# Patient Record
Sex: Female | Born: 1950 | Race: White | Hispanic: No | Smoking: Never smoker
Health system: Southern US, Community
[De-identification: ages and names within clinical notes are randomized; demographics above are authoritative.]

## PROBLEM LIST (undated history)

## (undated) DIAGNOSIS — R9431 Abnormal electrocardiogram [ECG] [EKG]: Secondary | ICD-10-CM

## (undated) DIAGNOSIS — I10 Essential (primary) hypertension: Secondary | ICD-10-CM

## (undated) DIAGNOSIS — N189 Chronic kidney disease, unspecified: Secondary | ICD-10-CM

## (undated) DIAGNOSIS — E119 Type 2 diabetes mellitus without complications: Secondary | ICD-10-CM

## (undated) DIAGNOSIS — E785 Hyperlipidemia, unspecified: Secondary | ICD-10-CM

## (undated) DIAGNOSIS — T8859XA Other complications of anesthesia, initial encounter: Secondary | ICD-10-CM

## (undated) DIAGNOSIS — F419 Anxiety disorder, unspecified: Secondary | ICD-10-CM

## (undated) DIAGNOSIS — Z8739 Personal history of other diseases of the musculoskeletal system and connective tissue: Secondary | ICD-10-CM

## (undated) DIAGNOSIS — M1990 Unspecified osteoarthritis, unspecified site: Secondary | ICD-10-CM

## (undated) DIAGNOSIS — M109 Gout, unspecified: Secondary | ICD-10-CM

## (undated) HISTORY — DX: Gout, unspecified: M10.9

## (undated) HISTORY — DX: Hyperlipidemia, unspecified: E78.5

## (undated) HISTORY — DX: Type 2 diabetes mellitus without complications: E11.9

## (undated) HISTORY — PX: COLONOSCOPY: SHX174

## (undated) HISTORY — DX: Abnormal electrocardiogram (ECG) (EKG): R94.31

## (undated) HISTORY — DX: Essential (primary) hypertension: I10

---
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
## 1983-03-19 HISTORY — PX: OTHER SURGICAL HISTORY: SHX169

## 1983-03-19 HISTORY — PX: APPENDECTOMY: SHX54

## 1999-12-19 ENCOUNTER — Other Ambulatory Visit: Admission: RE | Admit: 1999-12-19 | Discharge: 1999-12-19 | Payer: Self-pay | Admitting: Gastroenterology

## 2005-04-03 ENCOUNTER — Ambulatory Visit: Payer: Self-pay | Admitting: Cardiology

## 2005-04-05 ENCOUNTER — Ambulatory Visit: Payer: Self-pay | Admitting: Cardiovascular Disease

## 2005-04-05 ENCOUNTER — Inpatient Hospital Stay (HOSPITAL_BASED_OUTPATIENT_CLINIC_OR_DEPARTMENT_OTHER): Admission: RE | Admit: 2005-04-05 | Discharge: 2005-04-05 | Payer: Self-pay | Admitting: Cardiology

## 2005-05-10 ENCOUNTER — Ambulatory Visit: Payer: Self-pay | Admitting: Cardiology

## 2013-10-11 ENCOUNTER — Encounter: Payer: Self-pay | Admitting: Gastroenterology

## 2014-08-16 DIAGNOSIS — R10812 Left upper quadrant abdominal tenderness: Secondary | ICD-10-CM | POA: Insufficient documentation

## 2014-09-12 DIAGNOSIS — B029 Zoster without complications: Secondary | ICD-10-CM | POA: Insufficient documentation

## 2015-09-12 DIAGNOSIS — E782 Mixed hyperlipidemia: Secondary | ICD-10-CM | POA: Diagnosis not present

## 2015-09-12 DIAGNOSIS — I10 Essential (primary) hypertension: Secondary | ICD-10-CM | POA: Diagnosis not present

## 2015-09-12 DIAGNOSIS — E1165 Type 2 diabetes mellitus with hyperglycemia: Secondary | ICD-10-CM | POA: Diagnosis not present

## 2015-09-14 DIAGNOSIS — I10 Essential (primary) hypertension: Secondary | ICD-10-CM | POA: Diagnosis not present

## 2015-09-14 DIAGNOSIS — E1165 Type 2 diabetes mellitus with hyperglycemia: Secondary | ICD-10-CM | POA: Diagnosis not present

## 2015-09-14 DIAGNOSIS — E782 Mixed hyperlipidemia: Secondary | ICD-10-CM | POA: Diagnosis not present

## 2015-09-14 DIAGNOSIS — M25511 Pain in right shoulder: Secondary | ICD-10-CM | POA: Diagnosis not present

## 2015-09-14 DIAGNOSIS — M109 Gout, unspecified: Secondary | ICD-10-CM | POA: Diagnosis not present

## 2015-12-13 DIAGNOSIS — E782 Mixed hyperlipidemia: Secondary | ICD-10-CM | POA: Diagnosis not present

## 2015-12-13 DIAGNOSIS — M109 Gout, unspecified: Secondary | ICD-10-CM | POA: Diagnosis not present

## 2015-12-13 DIAGNOSIS — I10 Essential (primary) hypertension: Secondary | ICD-10-CM | POA: Diagnosis not present

## 2015-12-13 DIAGNOSIS — E1165 Type 2 diabetes mellitus with hyperglycemia: Secondary | ICD-10-CM | POA: Diagnosis not present

## 2015-12-17 DIAGNOSIS — M25519 Pain in unspecified shoulder: Secondary | ICD-10-CM | POA: Insufficient documentation

## 2015-12-17 DIAGNOSIS — Z6829 Body mass index (BMI) 29.0-29.9, adult: Secondary | ICD-10-CM | POA: Insufficient documentation

## 2015-12-18 DIAGNOSIS — Z1389 Encounter for screening for other disorder: Secondary | ICD-10-CM | POA: Diagnosis not present

## 2015-12-18 DIAGNOSIS — M25511 Pain in right shoulder: Secondary | ICD-10-CM | POA: Diagnosis not present

## 2015-12-18 DIAGNOSIS — E1165 Type 2 diabetes mellitus with hyperglycemia: Secondary | ICD-10-CM | POA: Diagnosis not present

## 2015-12-18 DIAGNOSIS — I10 Essential (primary) hypertension: Secondary | ICD-10-CM | POA: Diagnosis not present

## 2015-12-18 DIAGNOSIS — M109 Gout, unspecified: Secondary | ICD-10-CM | POA: Diagnosis not present

## 2015-12-18 DIAGNOSIS — E782 Mixed hyperlipidemia: Secondary | ICD-10-CM | POA: Diagnosis not present

## 2015-12-18 DIAGNOSIS — Z6829 Body mass index (BMI) 29.0-29.9, adult: Secondary | ICD-10-CM | POA: Diagnosis not present

## 2016-02-20 DIAGNOSIS — I10 Essential (primary) hypertension: Secondary | ICD-10-CM | POA: Insufficient documentation

## 2016-02-20 DIAGNOSIS — K219 Gastro-esophageal reflux disease without esophagitis: Secondary | ICD-10-CM | POA: Insufficient documentation

## 2016-02-21 DIAGNOSIS — I10 Essential (primary) hypertension: Secondary | ICD-10-CM | POA: Diagnosis not present

## 2016-02-21 DIAGNOSIS — E1165 Type 2 diabetes mellitus with hyperglycemia: Secondary | ICD-10-CM | POA: Diagnosis not present

## 2016-02-21 DIAGNOSIS — K219 Gastro-esophageal reflux disease without esophagitis: Secondary | ICD-10-CM | POA: Diagnosis not present

## 2016-02-21 DIAGNOSIS — E782 Mixed hyperlipidemia: Secondary | ICD-10-CM | POA: Diagnosis not present

## 2016-02-21 DIAGNOSIS — M109 Gout, unspecified: Secondary | ICD-10-CM | POA: Diagnosis not present

## 2016-02-25 DIAGNOSIS — E782 Mixed hyperlipidemia: Secondary | ICD-10-CM | POA: Insufficient documentation

## 2016-02-25 DIAGNOSIS — Z6830 Body mass index (BMI) 30.0-30.9, adult: Secondary | ICD-10-CM | POA: Insufficient documentation

## 2016-02-25 DIAGNOSIS — Z0000 Encounter for general adult medical examination without abnormal findings: Secondary | ICD-10-CM | POA: Insufficient documentation

## 2016-02-25 DIAGNOSIS — M109 Gout, unspecified: Secondary | ICD-10-CM | POA: Insufficient documentation

## 2016-02-25 DIAGNOSIS — R9431 Abnormal electrocardiogram [ECG] [EKG]: Secondary | ICD-10-CM | POA: Insufficient documentation

## 2016-02-25 DIAGNOSIS — E119 Type 2 diabetes mellitus without complications: Secondary | ICD-10-CM | POA: Insufficient documentation

## 2016-02-26 DIAGNOSIS — E782 Mixed hyperlipidemia: Secondary | ICD-10-CM | POA: Diagnosis not present

## 2016-02-26 DIAGNOSIS — I10 Essential (primary) hypertension: Secondary | ICD-10-CM | POA: Diagnosis not present

## 2016-02-26 DIAGNOSIS — Z0000 Encounter for general adult medical examination without abnormal findings: Secondary | ICD-10-CM | POA: Diagnosis not present

## 2016-02-26 DIAGNOSIS — R9431 Abnormal electrocardiogram [ECG] [EKG]: Secondary | ICD-10-CM | POA: Diagnosis not present

## 2016-02-26 DIAGNOSIS — E1165 Type 2 diabetes mellitus with hyperglycemia: Secondary | ICD-10-CM | POA: Diagnosis not present

## 2016-02-26 DIAGNOSIS — M109 Gout, unspecified: Secondary | ICD-10-CM | POA: Diagnosis not present

## 2016-04-05 ENCOUNTER — Encounter: Payer: Self-pay | Admitting: *Deleted

## 2016-04-08 ENCOUNTER — Ambulatory Visit (INDEPENDENT_AMBULATORY_CARE_PROVIDER_SITE_OTHER): Payer: Medicare Other | Admitting: Cardiovascular Disease

## 2016-04-08 ENCOUNTER — Encounter: Payer: Self-pay | Admitting: *Deleted

## 2016-04-08 ENCOUNTER — Encounter: Payer: Self-pay | Admitting: Cardiovascular Disease

## 2016-04-08 VITALS — BP 142/89 | HR 86 | Ht 62.0 in | Wt 174.0 lb

## 2016-04-08 DIAGNOSIS — E785 Hyperlipidemia, unspecified: Secondary | ICD-10-CM | POA: Diagnosis not present

## 2016-04-08 DIAGNOSIS — R0602 Shortness of breath: Secondary | ICD-10-CM

## 2016-04-08 DIAGNOSIS — I10 Essential (primary) hypertension: Secondary | ICD-10-CM

## 2016-04-08 DIAGNOSIS — R5383 Other fatigue: Secondary | ICD-10-CM

## 2016-04-08 DIAGNOSIS — R9431 Abnormal electrocardiogram [ECG] [EKG]: Secondary | ICD-10-CM

## 2016-04-08 NOTE — Patient Instructions (Signed)
Medication Instructions:  Continue all current medications.  Labwork: none  Testing/Procedures: Your physician has requested that you have en exercise stress myoview. For further information please visit [email or web address]. Please follow instruction sheet, as given.  Office will contact with results via phone or letter.    Follow-Up: 2 months   Any Other Special Instructions Will Be Listed Below (If Applicable).  If you need a refill on your cardiac medications before your next appointment, please call your pharmacy.  

## 2016-04-08 NOTE — Progress Notes (Signed)
CARDIOLOGY CONSULT NOTE  Patient ID: Angelica Conway MRN: [id number] DOB/AGE: 1950-07-23 66 y.o.  Admit date: (Not on file) Primary Physician: Jeri Modena Referring Physician:   Reason for Consultation: abnormal ECG  HPI: The patient is a 66 year old woman with a history of hypertension and hyperlipidemia referred for an abnormal ECG. ECG at PCPs office demonstrated sinus rhythm with a right bundle branch block.  A review of labs performed on 02/21/16 showed total cholesterol 205, triglycerides 268, HDL 35, LDL 118, hemoglobin 12.9, BUN 26, creatinine 1.14.  She has had progressive fatigue over the past 12 months. She has fatigue with routine household activities. She has occasional shortness of breath with this but denies exertional chest pain. She reports having undergone coronary angiography more than 10 years ago at [facility].  She denies lightheadedness, dizziness, and syncope.  Family history: Father died of MI at age 66.    Allergies  Allergen Reactions  . Amlodipine   . Codeine   . Influenza Vaccines Other (See Comments)    Local reaction at site of injection    Current Outpatient Prescriptions  Medication Sig Dispense Refill  . allopurinol (ZYLOPRIM) 300 MG tablet Take 300 mg by mouth daily.     Marland Kitchen diltiazem (CARDIZEM CD) 120 MG 24 hr capsule Take 120 mg by mouth daily.     Marland Kitchen gemfibrozil (LOPID) 600 MG tablet Take by mouth.    Marland Kitchen glipiZIDE (GLUCOTROL) 10 MG tablet Take 10 mg by mouth 2 (two) times daily before a meal.     . lisinopril-hydrochlorothiazide (PRINZIDE,ZESTORETIC) 20-12.5 MG tablet Take 2 tablets by mouth daily.     . metFORMIN (GLUCOPHAGE) 1000 MG tablet Take 1,000 mg by mouth 2 (two) times daily with a meal.     . metoprolol (LOPRESSOR) 100 MG tablet Take 100 mg by mouth 2 (two) times daily.     Marland Kitchen omeprazole (PRILOSEC) 20 MG capsule Take 20 mg by mouth daily.      No current facility-administered medications for this visit.      Past Medical History:  Diagnosis Date  . Abnormal EKG   . Diabetes mellitus without complication ([place])   . Gout   . Hyperlipidemia   . Hypertension     Past Surgical History:  Procedure Laterality Date  . APPENDECTOMY  1985  . cholecystectomy  1985  . TONSILLECTOMY  1957    Social History   Social History  . Marital status: Widowed    Spouse name: N/A  . Number of children: N/A  . Years of education: N/A   Occupational History  . Not on file.   Social History Main Topics  . Smoking status: Never Smoker  . Smokeless tobacco: Never Used  . Alcohol use No  . Drug use: Unknown  . Sexual activity: Not on file   Other Topics Concern  . Not on file   Social History Narrative  . No narrative on file       Prior to Admission medications   Medication Sig Start Date End Date Taking? Authorizing Provider  allopurinol (ZYLOPRIM) 300 MG tablet Take 300 mg by mouth daily.  02/26/16 08/23/16  Historical Provider, MD  diltiazem (CARDIZEM CD) 120 MG 24 hr capsule Take 120 mg by mouth daily.  02/26/16 08/23/16  Historical Provider, MD  gemfibrozil (LOPID) 600 MG tablet Take by mouth. 02/26/16 08/23/16  Historical Provider, MD  glipiZIDE (GLUCOTROL) 10 MG tablet Take 10 mg by mouth 2 (two)  times daily before a meal.  02/26/16 08/23/16  Historical Provider, MD  HYDROcodone-acetaminophen (NORCO/VICODIN) 5-325 MG tablet Take 1 tablet by mouth every 4 (four) hours as needed.  09/13/14   Historical Provider, MD  lisinopril-hydrochlorothiazide (PRINZIDE,ZESTORETIC) 20-12.5 MG tablet Take 2 tablets by mouth daily.  02/26/16 08/23/16  Historical Provider, MD  metFORMIN (GLUCOPHAGE) 1000 MG tablet Take 1,000 mg by mouth 2 (two) times daily with a meal.  02/26/16 08/23/16  Historical Provider, MD  metoprolol (LOPRESSOR) 100 MG tablet Take 100 mg by mouth 2 (two) times daily.  02/26/16 08/23/16  Historical Provider, MD  omeprazole (PRILOSEC) 20 MG capsule Take 20 mg by mouth daily.  12/18/15 06/14/16   Historical Provider, MD     Review of systems complete and found to be negative unless listed above in HPI     Physical exam Blood pressure (!) 152/87, pulse 85, height 5\' 2"  (1.575 m), weight 174 lb (78.9 kg), SpO2 98 %. General: NAD Neck: No JVD, no thyromegaly or thyroid nodule.  Lungs: Clear to auscultation bilaterally with normal respiratory effort. CV: Nondisplaced PMI. Regular rate and rhythm, normal S1/S2, no S3/S4, no murmur.  No peripheral edema.  No carotid bruit.   Abdomen: Soft, nontender, obese.  Skin: Intact without lesions or rashes.  Neurologic: Alert and oriented x 3.  Psych: Normal affect. Extremities: No clubbing or cyanosis.  HEENT: Normal.   ECG: Most recent ECG reviewed.  Labs:  No results found for: WBC, HGB, HCT, MCV, PLT No results for input(s): NA, K, CL, CO2, BUN, CREATININE, CALCIUM, PROT, BILITOT, ALKPHOS, ALT, AST, GLUCOSE in the last 168 hours.  Invalid input(s): LABALBU No results found for: CKTOTAL, CKMB, CKMBINDEX, TROPONINI No results found for: CHOL No results found for: HDL No results found for: LDLCALC No results found for: TRIG No results found for: CHOLHDL No results found for: LDLDIRECT       Studies: No results found.  ASSESSMENT AND PLAN:  1. Abnormal ECG/SOB/increased fatigue: ECG reviewed above. Cardiovascular risk factors include hypertension, hyperlipidemia, and diabetes. Will obtain an exercise Myoview nuclear stress test to rule out ischemic heart disease.  2. Dyslipidemia: Given history of diabetes, guidelines recommend statin therapy. She is currently taking Lopid.  3. Hypertension: Mildly elevated. Needs monitoring.  Dispo: fu 2 months.   Signed: Kate Sable, M.D., F.A.C.C.  04/08/2016, 2:50 PM

## 2016-04-11 ENCOUNTER — Inpatient Hospital Stay (HOSPITAL_COMMUNITY): Admission: RE | Admit: 2016-04-11 | Payer: Medicare Other | Source: Ambulatory Visit

## 2016-04-11 ENCOUNTER — Encounter (HOSPITAL_COMMUNITY): Payer: Self-pay

## 2016-04-11 ENCOUNTER — Encounter (HOSPITAL_COMMUNITY)
Admission: RE | Admit: 2016-04-11 | Discharge: 2016-04-11 | Disposition: A | Discharge status: Home / Self Care | Payer: Medicare Other | Source: Ambulatory Visit | Attending: Cardiovascular Disease | Admitting: Cardiovascular Disease

## 2016-04-11 DIAGNOSIS — R9431 Abnormal electrocardiogram [ECG] [EKG]: Secondary | ICD-10-CM

## 2016-04-11 DIAGNOSIS — R0602 Shortness of breath: Secondary | ICD-10-CM

## 2016-04-11 LAB — NM MYOCAR MULTI W/SPECT W/WALL MOTION / EF
CHL CUP NUCLEAR SDS: 0
CHL CUP NUCLEAR SRS: 1
CSEPEDS: 34 s
CSEPEW: 4.6 METS
CSEPHR: 91 %
CSEPPHR: 142 {beats}/min
Exercise duration (min): 2 min
LHR: 0.34
LVDIAVOL: 35 mL (ref 46–106)
LVSYSVOL: 12 mL
MPHR: 155 {beats}/min
RPE: 17
Rest HR: 96 {beats}/min
SSS: 1
TID: 1.01

## 2016-04-11 MED ORDER — TECHNETIUM TC 99M TETROFOSMIN IV KIT
10.0000 | PACK | Freq: Once | INTRAVENOUS | Status: AC | PRN
  Administered 2016-04-11: 10 via INTRAVENOUS

## 2016-04-11 MED ORDER — SODIUM CHLORIDE 0.9% FLUSH
INTRAVENOUS | Status: AC
  Administered 2016-04-11: 10 mL via INTRAVENOUS
  Filled 2016-04-11: qty 10

## 2016-04-11 MED ORDER — TECHNETIUM TC 99M TETROFOSMIN IV KIT
30.0000 | PACK | Freq: Once | INTRAVENOUS | Status: AC | PRN
  Administered 2016-04-11: 31 via INTRAVENOUS

## 2016-04-11 MED ORDER — REGADENOSON 0.4 MG/5ML IV SOLN
INTRAVENOUS | Status: AC
  Filled 2016-04-11: qty 5

## 2016-06-11 ENCOUNTER — Encounter: Payer: Self-pay | Admitting: Cardiovascular Disease

## 2016-06-11 ENCOUNTER — Ambulatory Visit (INDEPENDENT_AMBULATORY_CARE_PROVIDER_SITE_OTHER): Payer: Medicare Other | Admitting: Cardiovascular Disease

## 2016-06-11 VITALS — BP 130/81 | HR 74 | Ht 62.0 in | Wt 176.8 lb

## 2016-06-11 DIAGNOSIS — R9431 Abnormal electrocardiogram [ECG] [EKG]: Secondary | ICD-10-CM | POA: Diagnosis not present

## 2016-06-11 DIAGNOSIS — I10 Essential (primary) hypertension: Secondary | ICD-10-CM

## 2016-06-11 DIAGNOSIS — R5383 Other fatigue: Secondary | ICD-10-CM

## 2016-06-11 DIAGNOSIS — R0602 Shortness of breath: Secondary | ICD-10-CM | POA: Diagnosis not present

## 2016-06-11 DIAGNOSIS — E785 Hyperlipidemia, unspecified: Secondary | ICD-10-CM

## 2016-06-11 NOTE — Progress Notes (Signed)
SUBJECTIVE: The patient returns for follow-up after undergoing cardiovascular testing performed for the evaluation of abnormal ECG, shortness of breath, and fatigue.  Nuclear stress test 04/11/16: Hypertensive response to exercise, intermediate risk Duke treadmill score, no evidence of myocardial ischemia or scar, low risk study based on perfusion imaging, LVEF 65%.  She has occasional chest pains but she is not sure if it is due to anxiety. She has had shortness of breath and she was a child. She developed hypertension at the age of 57.   Review of Systems: As per "subjective", otherwise negative.  Allergies  Allergen Reactions  . Amlodipine   . Codeine   . Influenza Vaccines Other (See Comments)    Local reaction at site of injection    Current Outpatient Prescriptions  Medication Sig Dispense Refill  . allopurinol (ZYLOPRIM) 300 MG tablet Take 300 mg by mouth daily.     Marland Kitchen diltiazem (CARDIZEM CD) 120 MG 24 hr capsule Take 120 mg by mouth daily.     Marland Kitchen gemfibrozil (LOPID) 600 MG tablet Take by mouth.    Marland Kitchen glipiZIDE (GLUCOTROL) 10 MG tablet Take 10 mg by mouth 2 (two) times daily before a meal.     . lisinopril-hydrochlorothiazide (PRINZIDE,ZESTORETIC) 20-12.5 MG tablet Take 2 tablets by mouth daily.     . metFORMIN (GLUCOPHAGE) 1000 MG tablet Take 1,000 mg by mouth 2 (two) times daily with a meal.     . metoprolol (LOPRESSOR) 100 MG tablet Take 100 mg by mouth 2 (two) times daily.     Marland Kitchen omeprazole (PRILOSEC) 20 MG capsule Take 20 mg by mouth daily.      No current facility-administered medications for this visit.     Past Medical History:  Diagnosis Date  . Abnormal EKG   . Diabetes mellitus without complication ([place])   . Gout   . Hyperlipidemia   . Hypertension     Past Surgical History:  Procedure Laterality Date  . APPENDECTOMY  1985  . cholecystectomy  1985  . TONSILLECTOMY  1957    Social History   Social History  . Marital status: Widowed    Spouse  name: N/A  . Number of children: N/A  . Years of education: N/A   Occupational History  . Not on file.   Social History Main Topics  . Smoking status: Never Smoker  . Smokeless tobacco: Never Used  . Alcohol use No  . Drug use: Unknown  . Sexual activity: Not on file   Other Topics Concern  . Not on file   Social History Narrative  . No narrative on file     Vitals:   06/11/16 1035  BP: 130/81  Pulse: 74  SpO2: 98%  Weight: 176 lb 12.8 oz (80.2 kg)  Height: 5\' 2"  (1.575 m)    PHYSICAL EXAM General: NAD HEENT: Normal. Neck: No JVD, no thyromegaly. Lungs: Clear to auscultation bilaterally with normal respiratory effort. CV: Nondisplaced PMI.  Regular rate and rhythm, normal S1/S2, no S3/S4, no murmur. No pretibial or periankle edema.  No carotid bruit.   Abdomen: Soft, nontender, no distention.  Neurologic: Alert and oriented.  Psych: Normal affect. Skin: Normal. Musculoskeletal: No gross deformities.    ECG: Most recent ECG reviewed.      ASSESSMENT AND PLAN: 1. Abnormal ECG/SOB/increased fatigue: Low risk nuclear stress test as detailed above. Cardiovascular risk factors include hypertension, hyperlipidemia, and diabetes. Primary prevention of CAD emphasized.  2. Dyslipidemia: Given history of diabetes, guidelines  recommend statin therapy. She is currently taking Lopid.  3. Hypertension: Controlled. No changes.  Dispo: fu prn.   Kate Sable, M.D., F.A.C.C.

## 2016-06-11 NOTE — Patient Instructions (Signed)
Medication Instructions:  Continue all current medications.  Labwork: none  Testing/Procedures: none  Follow-Up: As needed.    Any Other Special Instructions Will Be Listed Below (If Applicable).  If you need a refill on your cardiac medications before your next appointment, please call your pharmacy.  

## 2016-07-15 DIAGNOSIS — E1165 Type 2 diabetes mellitus with hyperglycemia: Secondary | ICD-10-CM | POA: Diagnosis not present

## 2016-07-15 DIAGNOSIS — E782 Mixed hyperlipidemia: Secondary | ICD-10-CM | POA: Diagnosis not present

## 2016-07-15 DIAGNOSIS — M109 Gout, unspecified: Secondary | ICD-10-CM | POA: Diagnosis not present

## 2016-07-15 DIAGNOSIS — K219 Gastro-esophageal reflux disease without esophagitis: Secondary | ICD-10-CM | POA: Diagnosis not present

## 2016-07-15 DIAGNOSIS — I10 Essential (primary) hypertension: Secondary | ICD-10-CM | POA: Diagnosis not present

## 2020-12-26 DIAGNOSIS — I10 Essential (primary) hypertension: Secondary | ICD-10-CM | POA: Insufficient documentation

## 2021-01-29 ENCOUNTER — Encounter: Payer: Self-pay | Admitting: *Deleted

## 2021-01-30 ENCOUNTER — Ambulatory Visit: Payer: Medicare Other | Admitting: Cardiology

## 2021-01-30 ENCOUNTER — Encounter: Payer: Self-pay | Admitting: *Deleted

## 2021-01-30 ENCOUNTER — Encounter: Payer: Self-pay | Admitting: Cardiology

## 2021-01-30 VITALS — BP 200/102 | HR 109 | Ht 62.0 in | Wt 172.6 lb

## 2021-01-30 DIAGNOSIS — I10 Essential (primary) hypertension: Secondary | ICD-10-CM

## 2021-01-30 DIAGNOSIS — R001 Bradycardia, unspecified: Secondary | ICD-10-CM | POA: Diagnosis not present

## 2021-01-30 MED ORDER — HYDRALAZINE HCL 25 MG PO TABS: 50.0000 mg | ORAL_TABLET | Freq: Three times a day (TID) | ORAL | Status: DC

## 2021-01-30 NOTE — Progress Notes (Signed)
Clinical Summary Ms. Skousen is a 70 y.o.female last seen by Dr Bronson Ing in 2018, this is a new visit and our first visit together.      1. Bradycardia -ER visit [facility] 12/26/2020 with low HRs - was on cardizem and metoprolol at the time. Cardizem had recently been increased to 240mg  daily due to high bp's - from ER notes HRs in 30s, given atropine and calcium - HR imporved off meds    2. HTN - dilt and lopressor stopped after ER visit - was to start hydralazine 25mg  tid,a s well as lisinopril/HCTZ 40/25 - listed allergy to norvasc not specified. Leg edema per her report.    Past Medical History:  Diagnosis Date   Abnormal EKG    Diabetes mellitus without complication ([facility])    Gout    Hyperlipidemia    Hypertension      Allergies  Allergen Reactions   Amlodipine    Codeine    Influenza Vaccines Other (See Comments)    Local reaction at site of injection     Current Outpatient Medications  Medication Sig Dispense Refill   allopurinol (ZYLOPRIM) 300 MG tablet Take 300 mg by mouth daily.      diltiazem (CARDIZEM CD) 120 MG 24 hr capsule Take 120 mg by mouth daily.      gemfibrozil (LOPID) 600 MG tablet Take by mouth.     glipiZIDE (GLUCOTROL) 10 MG tablet Take 10 mg by mouth 2 (two) times daily before a meal.      lisinopril-hydrochlorothiazide (PRINZIDE,ZESTORETIC) 20-12.5 MG tablet Take 2 tablets by mouth daily.      metFORMIN (GLUCOPHAGE) 1000 MG tablet Take 1,000 mg by mouth 2 (two) times daily with a meal.      metoprolol (LOPRESSOR) 100 MG tablet Take 100 mg by mouth 2 (two) times daily.      omeprazole (PRILOSEC) 20 MG capsule Take 20 mg by mouth daily.      No current facility-administered medications for this visit.       Allergies  Allergen Reactions   Amlodipine    Codeine    Influenza Vaccines Other (See Comments)    Local reaction at site of injection      Family History  Problem Relation Age of Onset   Lung cancer Mother     Heart attack Father    Heart disease Father    Diabetes Sister      Social History Ms. Batts reports that she has never smoked. She has never used smokeless tobacco. Ms. Riden reports no history of alcohol use.   Review of Systems CONSTITUTIONAL: No weight loss, fever, chills, weakness or fatigue.  HEENT: Eyes: No visual loss, blurred vision, double vision or yellow sclerae.No hearing loss, sneezing, congestion, runny nose or sore throat.  SKIN: No rash or itching.  CARDIOVASCULAR: per hpi RESPIRATORY: No shortness of breath, cough or sputum.  GASTROINTESTINAL: No anorexia, nausea, vomiting or diarrhea. No abdominal pain or blood.  GENITOURINARY: No burning on urination, no polyuria NEUROLOGICAL: No headache, dizziness, syncope, paralysis, ataxia, numbness or tingling in the extremities. No change in bowel or bladder control.  MUSCULOSKELETAL: No muscle, back pain, joint pain or stiffness.  LYMPHATICS: No enlarged nodes. No history of splenectomy.  PSYCHIATRIC: No history of depression or anxiety.  ENDOCRINOLOGIC: No reports of sweating, cold or heat intolerance. No polyuria or polydipsia.  Marland Kitchen   Physical Examination Today's Vitals   01/30/21 1457  BP: (!) 200/102  Pulse: Marland Kitchen)  109  SpO2: 97%  Weight: 172 lb 9.6 oz (78.3 kg)  Height: 5\' 2"  (1.575 m)   Body mass index is 31.57 kg/m.  Gen: resting comfortably, no acute distress HEENT: no scleral icterus, pupils equal round and reactive, no palptable cervical adenopathy,  CV: regular, mild tachy 105, no m/r/g no jvd Resp: Clear to auscultation bilaterally GI: abdomen is soft, non-tender, non-distended, normal bowel sounds, no hepatosplenomegaly MSK: extremities are warm, no edema.  Skin: warm, no rash Neuro:  no focal deficits Psych: appropriate affect    Assessment and Plan  Bradycardia Secondary to high dose lopressor and diltiazem, now off both meds with resolution of bradycardia - I suspect mild sinus tach is her  baseline, for years she was on lopressor 100mg  bid and dilt 120 with rates in 70s, I suspect this is her baseline rate off av nodal agents. Recent EKG from pcp did show she was in sinus  2. HTN - avoid av nodal agents - some renal dysfunction from last labs, keep ACEi and diuretic where they are - increase hydralaazine to 50mg  tid. She will call later in the week with update on bp's, room to titratre hydral further - repeat labs end of the week, pending results could adjust ACEi or diuretic, could consider aldactone.       Arnoldo Lenis, M.D.

## 2021-01-30 NOTE — Patient Instructions (Addendum)
Medication Instructions:  Increase Hydralazine to 50mg  three times per day - she will take her 25mg  tabs for now.  Continue all other medications.     Labwork: BMET, TSH - orders given today.  Please do end of week. Office will contact with results via phone or letter.     Testing/Procedures: none  Follow-Up: Your physician wants you to follow up in: 6 months.  You will receive a reminder letter in the mail one-two months in advance.  If you don't receive a letter, please call our office to schedule the follow up appointment   Any Other Special Instructions Will Be Listed Below (If Applicable). Call the office with update on blood pressure readings at the end of the week.   If you need a refill on your cardiac medications before your next appointment, please call your pharmacy.

## 2021-02-02 ENCOUNTER — Telehealth: Payer: Self-pay | Admitting: Cardiology

## 2021-02-02 NOTE — Telephone Encounter (Signed)
Pt c/o BP issue: STAT if pt c/o blurred vision, one-sided weakness or slurred speech  1. What are your last 5 BP readings?  01/31/21 8:00 AM 221/139 HR 108 9:00 PM 224/123 HR 97 02/01/21 8:00 AM 215/133 HR 114 9:00 PM 219/133 HR 97 02/02/21 8:00 AM 215/123 HR 126 Now 216/131 HR 114  2. Are you having any other symptoms (ex. Dizziness, headache, blurred vision, passed out)? No  3. What is your BP issue? Hypertension, was advised to record readings and call in with them today   Has been stressed last few days daughter went into labor has been watching three grandchildren

## 2021-02-05 MED ORDER — HYDRALAZINE HCL 100 MG PO TABS: 100.0000 mg | ORAL_TABLET | Freq: Three times a day (TID) | ORAL | 2 refills | Status: DC

## 2021-02-05 NOTE — Telephone Encounter (Signed)
BP's too elevated, can she increase hydralazine to 100mg  tid, update [place] on bp's in 1 week   Zandra Abts MD

## 2021-02-05 NOTE — Telephone Encounter (Signed)
Patient informed and verbalized understanding of plan. 

## 2021-02-13 ENCOUNTER — Telehealth: Payer: Medicare Other | Admitting: Cardiology

## 2021-02-13 DIAGNOSIS — I10 Essential (primary) hypertension: Secondary | ICD-10-CM

## 2021-02-13 NOTE — Telephone Encounter (Signed)
Pt was advised to callback within a week to give her BP reading  203/111 HR-- 02/06/21--8AM 225/124 HR 101 --02/06/21--9PM  211/123 HR 111-- 02/07/21--8AM 215/119 HR 109 --02/07/21--9PM  215/112 HR 103-- 02/08/21--8AM 233/139 HR 119 --02/08/21--9PM  244/143 HR 116 --02/09/21--8AM 233/129 HR 100 --02/09/21--9PM  219/121 HR 119 --02/10/21--8AM 223/124 HR 114 --02/10/21--9PM  212/112 HR 115 --02/11/21--8AM 184/108 HR 83-- 02/11/21--9PM  215/115 HR 115 --02/12/21--8AM 211/115 HR 108-- 02/12/21--9PM  216/133 HR 101-- 02/13/21--AM

## 2021-02-14 ENCOUNTER — Encounter: Payer: Self-pay | Admitting: *Deleted

## 2021-02-14 NOTE — Telephone Encounter (Signed)
Recent labs done at PCP on 01/31/2021 and available on [facility]-see results below BUN 15 Creatinine 1.170 TSH 3/120  Full lab result requested from PCP

## 2021-02-14 NOTE — Telephone Encounter (Signed)
BP's too high. I don't see that she had the blood work done yet from our last visit. Can wee add a aldosterone renin ratio to her labs and as her to go ahead and get those drawn, will help decide on next med change    Angelica Abts MD

## 2021-02-15 ENCOUNTER — Other Ambulatory Visit: Payer: Self-pay | Admitting: *Deleted

## 2021-02-15 DIAGNOSIS — I10 Essential (primary) hypertension: Secondary | ICD-10-CM

## 2021-02-15 NOTE — Telephone Encounter (Signed)
Please order the aldosterone renin ratio labswork  Zandra Abts MD

## 2021-02-15 NOTE — Telephone Encounter (Signed)
Patient informed and verbalized understanding of plan. Lab order faxed to PCP office per patient request

## 2021-02-21 ENCOUNTER — Telehealth: Payer: Self-pay | Admitting: Cardiology

## 2021-02-21 NOTE — Telephone Encounter (Signed)
New Messasge:     Patient says she need a lab order faxed to her primary doctor(Katherine Skillman at [facility].Phone number is [phone].

## 2021-02-21 NOTE — Telephone Encounter (Signed)
Patient notified orders have been sent already - done on 12/1 & again today.

## 2021-03-08 ENCOUNTER — Telehealth: Payer: Self-pay | Admitting: *Deleted

## 2021-03-08 DIAGNOSIS — I10 Essential (primary) hypertension: Secondary | ICD-10-CM

## 2021-03-08 NOTE — Telephone Encounter (Signed)
Angelica Blazer, LPN  65/78/4696  2:95 PM EST Back to Top    Notified, copy to pcp. Stated that her readings have been running 160's / 80's and heart rate running 112-116.     Arnoldo Lenis, MD  03/07/2021  4:41 PM EST     Labs look fine, likely will start aldactone pending her recent bp's. Does she have any recent home numbers for [place]?   Zandra Abts MD

## 2021-03-08 NOTE — Telephone Encounter (Signed)
Can we start aldactone 25mg  daily, bmet 2 weeks and update [place] on bp's in 2 weeks.   Zandra Abts MD

## 2021-03-09 MED ORDER — SPIRONOLACTONE 25 MG PO TABS: 25.0000 mg | ORAL_TABLET | Freq: Every day | ORAL | 6 refills | Status: DC

## 2021-03-09 NOTE — Telephone Encounter (Signed)
Patient notified and verbalized understanding.  Will send new medication to [facility] now.  She will do her labs at [facility] around 03/23/2021.

## 2021-03-23 ENCOUNTER — Telehealth: Payer: Self-pay | Admitting: Cardiology

## 2021-03-23 DIAGNOSIS — I10 Essential (primary) hypertension: Secondary | ICD-10-CM

## 2021-03-23 NOTE — Telephone Encounter (Signed)
Patient called to give her BP readings: 12/24  168/97 HR 111 12/24 178/100 HR 105 12/25  167/96  HR 101 12/26 158/106 HR 111 12/26 168/97 HR 99 12/27 160/94 HR 111 12/27  169/101  HR 99 12/28  167/102 HR 98 12/28 175/102 HR 102 12/29  152/90 HR 109 12/29 176/98 HR 93 12/30 158/103 HR 101 12/30 160/95 HR 107 12/31 159/96 HR 107 12/31  158/99 HR 95 01/01 162/102 HR 110 01/01 165/95 HR105 01/02 150/90 HR 101 01/02 166/97 HR 103 01/03 165/95 HR 106 01/03 141/80 HR 99 01/04 162/93 HR 109 01/04 153/83 HR 94 01/05 156/93 HR 105 01/05 163/91 HR 101 01/06 154/90 HR 103

## 2021-03-28 MED ORDER — LISINOPRIL 20 MG PO TABS: 20.0000 mg | ORAL_TABLET | Freq: Every day | ORAL | 6 refills | Status: DC

## 2021-03-28 MED ORDER — CHLORTHALIDONE 25 MG PO TABS: 25.0000 mg | ORAL_TABLET | Freq: Every day | ORAL | 6 refills | Status: DC

## 2021-03-28 NOTE — Telephone Encounter (Signed)
Patient notified and verbalized understanding.  New medication sent to [facility] now.  Will mail lab order to home - she will go to [facility] around 04/11/2021.

## 2021-03-28 NOTE — Telephone Encounter (Signed)
BP's are still to high. Can she stop her zestoretic(lisinopril/hctz combo). STart liisinopril 20mg  daily and chlorthalidone 25mg  daily. Check bmet/mg in 2 weeks  Zandra Abts MD

## 2021-04-11 ENCOUNTER — Telehealth: Payer: Self-pay | Admitting: *Deleted

## 2021-04-11 MED ORDER — MAGNESIUM OXIDE 400 MG PO CAPS: 400.0000 mg | ORAL_CAPSULE | Freq: Two times a day (BID) | ORAL | 3 refills | Status: DC

## 2021-04-11 NOTE — Telephone Encounter (Signed)
Patient informed and verbalized understanding of plan. Copy sent to PCP 

## 2021-04-11 NOTE — Telephone Encounter (Signed)
-----   Message from Arnoldo Lenis, MD sent at 04/11/2021  2:33 PM EST ----- Magnesium is low, can she start magnesium oxide 400mg  bid x 4 days, then 400mg  daily. Mildly decreased kidney function but similar to prior range. Needs to be sure to stay well hydrated.   Zandra Abts MD

## 2021-04-13 ENCOUNTER — Telehealth: Payer: Self-pay | Admitting: Cardiology

## 2021-04-13 NOTE — Telephone Encounter (Signed)
Pt c/o BP issue: STAT if pt c/o blurred vision, one-sided weakness or slurred speech  1. What are your last 5 BP readings?  01/16: 8 AM 141/86 HR 106 9 PM 162/93 HR 107 01/17:  8 AM 158/90 HR 111 9 PM 164/94 HR 96 01/18:  8 AM 156/94 HR 114 9 PM 172/97 HR 103 01/19:  8 AM 149/93 HR 109 9 PM 140/82 HR 94 01/20:  8 AM 161/95 HR 101 9 PM 162/96 HR 108 01/21:  8 AM 163/96 HR 108 3 PM 142/85 HR 109 9 PM 161/90 HR 96 01/22: 10:30 AM 170/101 HR 108 9 PM 161/94 HR 95 01/23: 7 AM 160/92 HR 103 9 PM 161/96 HR 97 01/24: 10 AM 161/96 HR 101 9 PM 172/96 HR 98  01/25: 8 AM 180/103 HR 98 9 PM 188/112 HR 97 01/26:  9:30 AM 164/91 HR 109 9 PM 162/98 HR 108 01/27:  8:30 AM 175/103 HR 107  2. Are you having any other symptoms (ex. Dizziness, headache, blurred vision, passed out)? Right leg pain that goes all the way up into back and jittery   3. What is your BP issue? Reporting BP readings. States she has been having pain in her right leg that starts in her ankle up through her knee all the way to her hip since 01/23. On 01/24 the pain started going up through her back. She reports it hurts more when sitting and is the most painful when laying down. She becomes stiff at night due to it, but it begins getting better in the morning once she gets up and start moving around. She is unsure if it is due to one of the two medications she started on 01/16, lisinopril and chlorthalidone. Thinks BP recently being up could be due to the pain she's been in. Has been taking 1000 MG tylenol every 6 hours for it.

## 2021-04-17 NOTE — Telephone Encounter (Signed)
The bp meds she is on would not typically cause leg pains. Sometimes can cause muscle cramps but would not be isolated in just one leg. I would have her see her pcp about the pain. Bp's are still elevated but coule be relted to pain. I would cut back on the tyleonol, that is the max dose and could cause some liver issues. Would take no more than 1000mg  every 8 hours.

## 2021-04-17 NOTE — Telephone Encounter (Signed)
Left message to return call 

## 2021-04-23 ENCOUNTER — Other Ambulatory Visit: Payer: Self-pay | Admitting: Cardiology

## 2021-04-27 DIAGNOSIS — E875 Hyperkalemia: Secondary | ICD-10-CM | POA: Insufficient documentation

## 2021-04-27 DIAGNOSIS — D72829 Elevated white blood cell count, unspecified: Secondary | ICD-10-CM | POA: Insufficient documentation

## 2021-04-27 DIAGNOSIS — E871 Hypo-osmolality and hyponatremia: Secondary | ICD-10-CM | POA: Insufficient documentation

## 2021-04-27 DIAGNOSIS — N183 Chronic kidney disease, stage 3 unspecified: Secondary | ICD-10-CM | POA: Insufficient documentation

## 2021-04-27 DIAGNOSIS — Z87898 Personal history of other specified conditions: Secondary | ICD-10-CM | POA: Insufficient documentation

## 2021-04-27 NOTE — Telephone Encounter (Signed)
Spoke with patient - stated she did see her pcp about a week ago - they have scheduled her for a MRI next Wednesday.  She also has f/u with her pcp on 05/01/2021.

## 2021-05-08 DIAGNOSIS — Z7901 Long term (current) use of anticoagulants: Secondary | ICD-10-CM | POA: Insufficient documentation

## 2021-05-08 DIAGNOSIS — R7989 Other specified abnormal findings of blood chemistry: Secondary | ICD-10-CM | POA: Insufficient documentation

## 2021-05-09 DIAGNOSIS — R339 Retention of urine, unspecified: Secondary | ICD-10-CM | POA: Insufficient documentation

## 2021-05-22 ENCOUNTER — Other Ambulatory Visit: Payer: Self-pay

## 2021-05-22 ENCOUNTER — Ambulatory Visit: Payer: Medicare Other | Admitting: Physician Assistant

## 2021-05-22 ENCOUNTER — Ambulatory Visit: Payer: Medicare Other | Admitting: Urology

## 2021-05-22 VITALS — BP 153/80 | HR 111 | Ht 62.0 in | Wt 166.0 lb

## 2021-05-22 DIAGNOSIS — R339 Retention of urine, unspecified: Secondary | ICD-10-CM | POA: Diagnosis not present

## 2021-05-22 NOTE — Progress Notes (Signed)
? ?05/22/2021 ?11:23 AM  ? ?[name] ?Dec 15, 1950 ?[id number] ? ? ?Assessment: ?Encounter Diagnoses  ?Name Primary?  ? Urinary retention Yes  ? ? ?No follow-ups on file. ? ? ?Plan: ?Orders written for the patient to discontinue oxybutynin and return in 10 to 14 days to attempt voiding trial again.  Catheter care instructions given.  The patient is encouraged to work with therapy as directed in order to become independently ambulatory again to allow easier access to the restroom.  Medical records requested from [facility]. ? ?Chief Complaint: Voiding trial ? ?Referring provider: Rosalee Kaufman, PA-C ?[address] ?[place],  [place] [address] ? ? ?History of Present Illness: ? ?Angelica Conway is a 71 y.o. year old female with h/o CKD with hospital admission at [facility] on 05/08/2021 for hyponatremia with encephalopathy, who is seen in consultation from Rosalee Kaufman, [place] for evaluation of urinary retention.  Foley catheter was placed on 05/08/2021 as patient was unable to void during voiding trial attempted in the emergency department.  Catheter originally placed during hospitalization 1 week prior.  At discharge, the patient was transferred to [facility] rehab and nursing care center where she was placed on oxybutynin on 05/18/2021.  Patient reports a history of urinary issues with retention in the past which she has had treated at [facility] urology. Those medical records not available for review today.  Currently, she denies fever, chills, abdominal pain.  Her Foley is draining well, urine straw in color with mild sediment.  No gross hematuria.  She is working with physical therapy at her facility and is somewhat ambulatory with assistance at this time.   ? ? ?Past Medical History:  ?Past Medical History:  ?Diagnosis Date  ? Abnormal EKG   ? Diabetes mellitus without complication ([place])   ? Gout   ? Hyperlipidemia   ? Hypertension   ? ? ?Past Surgical History:  ?Past Surgical History:   ?Procedure Laterality Date  ? APPENDECTOMY  1985  ? cholecystectomy  1985  ? TONSILLECTOMY  1957  ? ? ?Allergies:  ?Allergies  ?Allergen Reactions  ? Amlodipine   ? Codeine   ? Influenza Vaccines Other (See Comments)  ?  Local reaction at site of injection  ? ? ?Family History:  ?Family History  ?Problem Relation Age of Onset  ? Lung cancer Mother   ? Heart attack Father   ? Heart disease Father   ? Diabetes Sister   ? ? ?Social History:  ?Social History  ? ?Tobacco Use  ? Smoking status: Never  ? Smokeless tobacco: Never  ?Vaping Use  ? Vaping Use: Never used  ?Substance Use Topics  ? Alcohol use: No  ? ? ?Review of symptoms:  ?Constitutional:  Negative for unexplained weight loss, night sweats, fever, chills ?ENT:  Negative for nose bleeds, sinus pain, painful swallowing ?CV:  Negative for chest pain, shortness of breath ?Resp:  Negative for cough, wheezing ?GI:  Negative for nausea, vomiting, diarrhea, bloody stools ?GU:  Positives noted in HPI; otherwise negative for gross hematuria, dysuria, urinary incontinence ?Neuro:  Negative for seizures, slurred speech ?Endocrine:  Negative for polydipsia, polyuria, symptoms of hypoglycemia (dizziness, hunger, sweating) ?Hematologic:  Negative for anemia, purpura, petechia, prolonged or excessive bleeding ?Allergic:  Negative for difficulty breathing or choking as a result of exposure to anything; no shellfish allergy; no allergic response (rash/itch) to materials, foods ? ? ?Physical Exam: ?BP (!) 153/80   Pulse (!) 111   Ht  $'5\' 2"'S$  (1.575 m)   Wt 166 lb (75.3 kg)   BMI 30.36 kg/m?   ?Constitutional:  Alert and oriented, No acute distress. ?HEENT: NCAT, moist mucus membranes.  Trachea midline, no masses. ?Cardiovascular: Regular rate and rhythm without murmur, rub, or gallops ?No clubbing, cyanosis, or edema. ?Respiratory: Normal respiratory effort, clear to auscultation bilaterally ?GI: Abdomen is soft, nontender, nondistended, no abdominal masses ?BACK:  Non-tender  to palpation.  No CVAT ?Skin: No obvious rashes, warm, dry, intact ?Neurologic: Alert and oriented, Cranial nerves grossly intact, no focal deficits, moving all 4 extremities. ?Psychiatric: Appropriate. Normal mood and affect. ? ?Laboratory Data: ?No results found for this or any previous visit (from the past 24 hour(s)). ?Medical records including notes, imaging, lab results reviewed during patient's office visit. ? ? ?Urinalysis ?No results found for: COLORURINE, APPEARANCEUR, [place], [place], [place], [place], [place], KETONESUR, PROTEINUR, [place], NITRITE, LEUKOCYTESUR ? ?No results found for: LABMICR, [place], RBCUA, LABEPIT, MUCUS, BACTERIA ? ?Pertinent Imaging: ? ?No results found for this or any previous visit. ? ?No results found for this or any previous visit. ? ? ? ? ?[place], Berneice Heinrich, PA-C ?[facility] ?  ?

## 2021-05-29 ENCOUNTER — Ambulatory Visit: Payer: Medicare Other | Admitting: Physician Assistant

## 2021-05-29 ENCOUNTER — Other Ambulatory Visit: Payer: Self-pay

## 2021-05-29 VITALS — BP 184/92 | HR 112 | Ht 62.0 in | Wt 154.0 lb

## 2021-05-29 DIAGNOSIS — N289 Disorder of kidney and ureter, unspecified: Secondary | ICD-10-CM

## 2021-05-29 DIAGNOSIS — R339 Retention of urine, unspecified: Secondary | ICD-10-CM | POA: Diagnosis not present

## 2021-05-29 NOTE — Progress Notes (Signed)
Fill and Pull Catheter Removal ? ?Patient is present today for a catheter removal.  Patient was cleaned and prepped in a sterile fashion 366m of sterile water/ saline was instilled into the bladder when the patient felt the urge to urinate. 144mof water was then drained from the balloon.  A 16FR foley cath was removed from the bladder no complications were noted .  Patient as then given some time to void on their own.  Patient can void  35039mn their own after some time.  Patient tolerated well. ? ?Performed by: KouLevi AlandMA ? ?Follow up/ Additional notes: Follow up as scheduled. ?

## 2021-05-29 NOTE — Progress Notes (Signed)
? ?Assessment: ?1. Urinary retention   ?2. Renal function impairment   ? ? ?Plan: ?Voiding trial today successful. Keep appointment with her primary care provider tomorrow to discuss recent labs, renal function and referral to nephrology.  She is advised to hold Myrbetriq which she has had in the past.  Follow-up in 6 weeks for PVR and urinalysis. ? ?Chief Complaint: ?No chief complaint on file. ? ? ?HPI: ?05/29/21 ?Angelica Conway is a 71 y.o. female who presents for continued evaluation of urinary retention and has tolerated her catheter well. She is doing well with PT and is now able to transfer and ambulate with a walker to get to the restroom, but continues to have sciatic pain and difficulty with ambulation. No fever, chills, gross hematuria.  She has held oxybutynin since her last visit.  Primary care follow-up is scheduled for tomorrow to discuss recent labs and need for referral to nephrology for abnormal renal function studies ? ?05/22/21 ?Angelica Conway is a 71 y.o. year old female with h/o CKD with hospital admission at [facility] on 05/08/2021 for hyponatremia with encephalopathy, who is seen in consultation from Rosalee Kaufman, [place] for evaluation of urinary retention.  Foley catheter was placed on 05/08/2021 as patient was unable to void during voiding trial attempted in the emergency department.  Catheter originally placed during hospitalization 1 week prior.  At discharge, the patient was transferred to [facility] rehab and nursing care center where she was placed on oxybutynin on 05/18/2021.  Patient reports a history of urinary issues with retention in the past which she has had treated at [facility] urology. Those medical records not available for review today.  Currently, she denies fever, chills, abdominal pain.  Her Foley is draining well, urine straw in color with mild sediment.  No gross hematuria.  She is working with physical therapy at her facility and is somewhat ambulatory  with assistance at this time.   ?  ? ?Portions of the above documentation were copied from a prior visit for review purposes only. ? ?Allergies: ?Allergies  ?Allergen Reactions  ? Amlodipine   ? Codeine   ? Influenza Vaccines Other (See Comments)  ?  Local reaction at site of injection  ? ? ?PMH: ?Past Medical History:  ?Diagnosis Date  ? Abnormal EKG   ? Diabetes mellitus without complication ([place])   ? Gout   ? Hyperlipidemia   ? Hypertension   ? ? ?PSH: ?Past Surgical History:  ?Procedure Laterality Date  ? APPENDECTOMY  1985  ? cholecystectomy  1985  ? TONSILLECTOMY  1957  ? ? ?SH: ?Social History  ? ?Tobacco Use  ? Smoking status: Never  ? Smokeless tobacco: Never  ?Vaping Use  ? Vaping Use: Never used  ?Substance Use Topics  ? Alcohol use: No  ? ? ?ROS: ?Constitutional:  Negative for fever, chills, weight loss ?CV: Negative for chest pain ?Respiratory:  Negative for shortness of breath, wheezing, sleep apnea, frequent cough ?GI:  Negative for nausea, vomiting, bloody stool, GERD ? ?PE: ?BP (!) 184/92   Pulse (!) 112   Ht '5\' 2"'$  (1.575 m)   Wt 154 lb (69.9 kg)   BMI 28.17 kg/m?  ?GENERAL APPEARANCE:  Well appearing, well developed, well nourished, NAD ?HEENT:  Atraumatic, normocephalic ?NECK:  Supple. Trachea midline ?ABDOMEN:  Soft, non-tender, no masses ?EXTREMITIES:  Moves all extremities well, without clubbing, cyanosis, or edema ?NEUROLOGIC:  Alert and oriented x 3 ?MENTAL STATUS:  appropriate ?BACK:  No CVAT ?SKIN:  Warm, dry, and intact ? ? ?Results: ?Laboratory Data: ?No results found for: WBC, HGB, HCT, MCV, PLT ? ? ?Urinalysis ?No results found for: COLORURINE, APPEARANCEUR, [place], [place], [place], [place], [place], KETONESUR, PROTEINUR, [place], NITRITE, LEUKOCYTESUR ? ?No results found for: LABMICR, [place], RBCUA, LABEPIT, MUCUS, BACTERIA ? ?Pertinent Imaging: ? ?No results found for this or any previous visit. ? ?No results found for this or any previous visit. ? ?No results found for  this or any previous visit. ? ?No results found for this or any previous visit. ? ?No results found for this or any previous visit. ? ?No results found for this or any previous visit. ? ?No results found for this or any previous visit. ? ?No results found for this or any previous visit. ? ?No results found for this or any previous visit (from the past 24 hour(s)).  ?

## 2021-07-06 ENCOUNTER — Ambulatory Visit: Payer: Medicare Other | Admitting: Physician Assistant

## 2021-07-09 ENCOUNTER — Ambulatory Visit: Payer: Medicare Other | Admitting: Physician Assistant

## 2021-07-20 ENCOUNTER — Ambulatory Visit: Payer: Medicare Other | Admitting: Physician Assistant

## 2021-07-26 ENCOUNTER — Other Ambulatory Visit (HOSPITAL_COMMUNITY): Payer: Self-pay | Admitting: Nephrology

## 2021-07-26 ENCOUNTER — Other Ambulatory Visit: Payer: Self-pay | Admitting: Nephrology

## 2021-07-26 DIAGNOSIS — E1122 Type 2 diabetes mellitus with diabetic chronic kidney disease: Secondary | ICD-10-CM

## 2021-07-26 DIAGNOSIS — R809 Proteinuria, unspecified: Secondary | ICD-10-CM

## 2021-07-26 DIAGNOSIS — E875 Hyperkalemia: Secondary | ICD-10-CM

## 2021-07-26 DIAGNOSIS — E871 Hypo-osmolality and hyponatremia: Secondary | ICD-10-CM

## 2021-08-03 ENCOUNTER — Encounter: Payer: Self-pay | Admitting: Cardiology

## 2021-08-03 ENCOUNTER — Ambulatory Visit (HOSPITAL_COMMUNITY)
Admission: RE | Admit: 2021-08-03 | Discharge: 2021-08-03 | Disposition: A | Discharge status: Home / Self Care | Payer: Medicare Other | Source: Ambulatory Visit | Attending: Nephrology | Admitting: Nephrology

## 2021-08-03 ENCOUNTER — Ambulatory Visit: Payer: Medicare Other | Admitting: Cardiology

## 2021-08-03 VITALS — BP 136/88 | HR 99 | Ht 62.0 in | Wt 136.0 lb

## 2021-08-03 DIAGNOSIS — E1129 Type 2 diabetes mellitus with other diabetic kidney complication: Secondary | ICD-10-CM | POA: Diagnosis present

## 2021-08-03 DIAGNOSIS — R001 Bradycardia, unspecified: Secondary | ICD-10-CM | POA: Diagnosis not present

## 2021-08-03 DIAGNOSIS — E1122 Type 2 diabetes mellitus with diabetic chronic kidney disease: Secondary | ICD-10-CM | POA: Insufficient documentation

## 2021-08-03 DIAGNOSIS — R809 Proteinuria, unspecified: Secondary | ICD-10-CM | POA: Insufficient documentation

## 2021-08-03 DIAGNOSIS — I10 Essential (primary) hypertension: Secondary | ICD-10-CM

## 2021-08-03 DIAGNOSIS — E871 Hypo-osmolality and hyponatremia: Secondary | ICD-10-CM | POA: Diagnosis not present

## 2021-08-03 DIAGNOSIS — E875 Hyperkalemia: Secondary | ICD-10-CM | POA: Diagnosis present

## 2021-08-03 NOTE — Patient Instructions (Signed)
Medication Instructions:  Continue all current medications.   Labwork: none  Testing/Procedures: none  Follow-Up: 6 months   Any Other Special Instructions Will Be Listed Below (If Applicable).   If you need a refill on your cardiac medications before your next appointment, please call your pharmacy.  

## 2021-08-03 NOTE — Progress Notes (Signed)
Clinical Summary Angelica Conway is a 71 y.o.female seen today for follow up of the following medical problems.   1. Bradycardia -ER visit [facility] 12/26/2020 with low HRs - was on cardizem and metoprolol at the time. Cardizem had recently been increased to '240mg'$  daily due to high bp's - from ER notes HRs in 30s, given atropine and calcium - HR imporved off meds  - no recent issues       2. HTN - dilt and lopressor stopped after ER visit - was to start hydralazine '25mg'$  tid,a s well as lisinopril/HCTZ 40/25 - listed allergy to norvasc not specified. Leg edema per her report.   -02/2021 normal renin/aldo levels  - neprhology started coreg 07/21/21  - home bp's 130s-140s/80s-90s  3. CKD III - followed by Dr Theador Hawthorne  4. Hyponatremia - from neprhology notes component of [facility]  Past Medical History:  Diagnosis Date   Abnormal EKG    Diabetes mellitus without complication (HCC)    Gout    Hyperlipidemia    Hypertension      Allergies  Allergen Reactions   Amlodipine    Codeine    Influenza Vaccines Other (See Comments)    Local reaction at site of injection     Current Outpatient Medications  Medication Sig Dispense Refill   allopurinol (ZYLOPRIM) 300 MG tablet Take 300 mg by mouth daily.      atorvastatin (LIPITOR) 80 MG tablet Take 80 mg by mouth daily.     chlorthalidone (HYGROTON) 25 MG tablet Take 1 tablet (25 mg total) by mouth daily. 30 tablet 6   gabapentin (NEURONTIN) 100 MG capsule SMARTSIG:1 Capsule(s) By Mouth Every Evening (Patient not taking: Reported on 05/29/2021)     gemfibrozil (LOPID) 600 MG tablet Take by mouth.     glipiZIDE (GLUCOTROL) 10 MG tablet Take 10 mg by mouth 2 (two) times daily before a meal.      hydrALAZINE (APRESOLINE) 100 MG tablet TAKE 1 TABLET BY MOUTH THREE TIMES DAILY 90 tablet 6   lisinopril (ZESTRIL) 20 MG tablet Take 1 tablet (20 mg total) by mouth daily. 30 tablet 6   Magnesium Oxide 400 MG CAPS Take 1 capsule (400 mg  total) by mouth 2 (two) times daily. For 4 days, then reduce to daily 38 capsule 3   metFORMIN (GLUCOPHAGE) 1000 MG tablet Take 1,000 mg by mouth 2 (two) times daily with a meal.      MYRBETRIQ 25 MG TB24 tablet Take 25 mg by mouth daily.     omeprazole (PRILOSEC) 20 MG capsule Take 20 mg by mouth daily.      spironolactone (ALDACTONE) 25 MG tablet Take 1 tablet (25 mg total) by mouth daily. 30 tablet 6   No current facility-administered medications for this visit.     Past Surgical History:  Procedure Laterality Date   APPENDECTOMY  1985   cholecystectomy  1985   TONSILLECTOMY  1957     Allergies  Allergen Reactions   Amlodipine    Codeine    Influenza Vaccines Other (See Comments)    Local reaction at site of injection      Family History  Problem Relation Age of Onset   Lung cancer Mother    Heart attack Father    Heart disease Father    Diabetes Sister      Social History Angelica Conway reports that she has never smoked. She has never used smokeless tobacco. Angelica Conway reports no history of  alcohol use.   Review of Systems CONSTITUTIONAL: No weight loss, fever, chills, weakness or fatigue.  HEENT: Eyes: No visual loss, blurred vision, double vision or yellow sclerae.No hearing loss, sneezing, congestion, runny nose or sore throat.  SKIN: No rash or itching.  CARDIOVASCULAR: per hpi RESPIRATORY: No shortness of breath, cough or sputum.  GASTROINTESTINAL: No anorexia, nausea, vomiting or diarrhea. No abdominal pain or blood.  GENITOURINARY: No burning on urination, no polyuria NEUROLOGICAL: No headache, dizziness, syncope, paralysis, ataxia, numbness or tingling in the extremities. No change in bowel or bladder control.  MUSCULOSKELETAL: No muscle, back pain, joint pain or stiffness.  LYMPHATICS: No enlarged nodes. No history of splenectomy.  PSYCHIATRIC: No history of depression or anxiety.  ENDOCRINOLOGIC: No reports of sweating, cold or heat intolerance. No  polyuria or polydipsia.  Marland Kitchen   Physical Examination Today's Vitals   08/03/21 1433  BP: 136/88  Pulse: 99  SpO2: 98%  Weight: 136 lb (61.7 kg)  Height: '5\' 2"'$  (1.575 m)   Body mass index is 24.87 kg/m.  Gen: resting comfortably, no acute distress HEENT: no scleral icterus, pupils equal round and reactive, no palptable cervical adenopathy,  CV: RRR, no m/r/g no jvd Resp: Clear to auscultation bilaterally GI: abdomen is soft, non-tender, non-distended, normal bowel sounds, no hepatosplenomegaly MSK: extremities are warm, no edema.  Skin: warm, no rash Neuro:  no focal deficits Psych: appropriate affect      Assessment and Plan    Bradycardia Secondary to high dose lopressor and diltiazem, now off both meds with resolution of bradycardia - tolerating low dose coreg as additional hypertensive at this time, continue to monitor.    2. HTN - issues with hyponatremia, off chlorthalidone and aldactone - reasonable bp today, continue current meds      Arnoldo Lenis, M.D

## 2021-09-12 ENCOUNTER — Encounter: Payer: Self-pay | Admitting: Internal Medicine

## 2021-09-19 ENCOUNTER — Other Ambulatory Visit: Payer: Self-pay | Admitting: Cardiology

## 2021-10-08 NOTE — Progress Notes (Unsigned)
GI Office Note    Referring Provider: Royann Shivers, * Primary Care Physician:  Sheela Stack  Primary Gastroenterologist: Dr. Jena Gauss  Chief Complaint   No chief complaint on file.    History of Present Illness   Angelica Conway is a 71 y.o. female presenting today at the request of Royann Shivers, * for ***hemorrhoids and TCS.  It appears patient has previously seen Dr. Corinda Gubler in 2001 and underwent colonoscopy and EGD.  EGD with gastritis, mild esophagitis, decreased LES, and 2 cm hiatal hernia, rapid urease test positive.  Colonoscopy with internal and external hemorrhoids, biopsies taken from hepatic flexure due to underlying colitis, 5 mm sessile polyp (hyperplastic) to sigmoid colon, exam otherwise normal from ileum to sigmoid, repeat colonoscopy every 4 years advised.  Most recent lab work on file from April 2023 with normal hemoglobin and PT/INR, UA positive for proteins, ketones, nitrates.  CMP normal with creatinine 0.98. (Patient's baseline creatinine appears to be 1-1.6)  Today:     Past Medical History:  Diagnosis Date   Abnormal EKG    Diabetes mellitus without complication (HCC)    Gout    Hyperlipidemia    Hypertension     Past Surgical History:  Procedure Laterality Date   APPENDECTOMY  1985   cholecystectomy  1985   TONSILLECTOMY  1957    Current Outpatient Medications  Medication Sig Dispense Refill   allopurinol (ZYLOPRIM) 300 MG tablet Take 300 mg by mouth daily.      atorvastatin (LIPITOR) 80 MG tablet Take 80 mg by mouth daily.     carvedilol (COREG) 3.125 MG tablet Take 6.25 mg by mouth 2 (two) times daily.     hydrALAZINE (APRESOLINE) 100 MG tablet TAKE 1 TABLET BY MOUTH THREE TIMES DAILY 90 tablet 6   JARDIANCE 25 MG TABS tablet Take 25 mg by mouth daily.     lisinopril (ZESTRIL) 20 MG tablet TAKE 1 TABLET BY MOUTH DAILY 30 tablet 6   magnesium oxide (MAG-OX) 400 (240 Mg) MG tablet Take 1 tablet by mouth 2 (two)  times daily.     metFORMIN (GLUCOPHAGE) 1000 MG tablet Take 1,000 mg by mouth 2 (two) times daily with a meal.      omeprazole (PRILOSEC) 20 MG capsule Take 20 mg by mouth daily.      No current facility-administered medications for this visit.    Allergies as of 10/09/2021 - Review Complete 08/03/2021  Allergen Reaction Noted   Amlodipine  06/13/2014   Codeine  08/02/2014   Influenza vaccines Other (See Comments) 04/08/2016    Family History  Problem Relation Age of Onset   Lung cancer Mother    Heart attack Father    Heart disease Father    Diabetes Sister     Social History   Socioeconomic History   Marital status: Widowed    Spouse name: Not on file   Number of children: Not on file   Years of education: Not on file   Highest education level: Not on file  Occupational History   Not on file  Tobacco Use   Smoking status: Never   Smokeless tobacco: Never  Vaping Use   Vaping Use: Never used  Substance and Sexual Activity   Alcohol use: No   Drug use: Not on file   Sexual activity: Not on file  Other Topics Concern   Not on file  Social History Narrative   Not on file   Social  Determinants of Health   Financial Resource Strain: Not on file  Food Insecurity: Not on file  Transportation Needs: Not on file  Physical Activity: Not on file  Stress: Not on file  Social Connections: Not on file  Intimate Partner Violence: Not on file     Review of Systems   Gen: Denies any fever, chills, fatigue, weight loss, lack of appetite.  CV: Denies chest pain, heart palpitations, peripheral edema, syncope.  Resp: Denies shortness of breath at rest or with exertion. Denies wheezing or cough.  GI: See HPI GU : Denies urinary burning, urinary frequency, urinary hesitancy MS: Denies joint pain, muscle weakness, cramps, or limitation of movement.  Derm: Denies rash, itching, dry skin Psych: Denies depression, anxiety, memory loss, and confusion Heme: Denies bruising,  bleeding, and enlarged lymph nodes.   Physical Exam   There were no vitals taken for this visit.  General:   Alert and oriented. Pleasant and cooperative. Well-nourished and well-developed.  Head:  Normocephalic and atraumatic. Eyes:  Without icterus, sclera clear and conjunctiva pink.  Ears:  Normal auditory acuity. Mouth:  No deformity or lesions, oral mucosa pink.  Lungs:  Clear to auscultation bilaterally. No wheezes, rales, or rhonchi. No distress.  Heart:  S1, S2 present without murmurs appreciated.  Abdomen:  +BS, soft, non-tender and non-distended. No HSM noted. No guarding or rebound. No masses appreciated.  Rectal:  Deferred  Msk:  Symmetrical without gross deformities. Normal posture. Extremities:  Without edema. Neurologic:  Alert and  oriented x4;  grossly normal neurologically. Skin:  Intact without significant lesions or rashes. Psych:  Alert and cooperative. Normal mood and affect.   Assessment   Angelica Conway is a 71 y.o. female with a history of HLD, HTN, diabetes, CKD stage IIIa diagnosed in 2022, MGUS, B12 deficiency*** presenting today with complaint of hemorrhoids need to schedule TCS.  History of colon polyp: Last colonoscopy in 2001 with 5 mm hyperplastic polyp in sigmoid colon.  Hemorrhoids:   PLAN   *** Proceed with colonoscopy with propofol by Dr. Jena Gauss in near future: the risks, benefits, and alternatives have been discussed with the patient in detail. The patient states understanding and desires to proceed. Prep with TriLyte Hold oral diabetes medications the night prior to morning of procedure    Brooke Bonito, MSN, FNP-BC, AGACNP-BC [facility]

## 2021-10-08 NOTE — H&P (View-Only) (Signed)
GI Office Note    Referring Provider: Rosalee Kaufman, * Primary Care Physician:  Rosine Door  Primary Gastroenterologist: Dr. Gala Romney  Chief Complaint   Chief Complaint  Patient presents with   Colonoscopy    Has IBS will have diarrhea for a couple days then will be constipated for a couple days.   Hemorrhoids    Have been bleeding   Abdominal Pain    Right side     History of Present Illness   Angelica Conway is a 71 y.o. female presenting today at the request of Rosalee Kaufman, * for hemorrhoids and TCS.  It appears patient has previously seen Dr. Velora Heckler in 2001 and underwent colonoscopy and EGD.  EGD with gastritis, mild esophagitis, decreased LES, and 2 cm hiatal hernia, rapid urease test positive.  Colonoscopy with internal and external hemorrhoids, biopsies taken from hepatic flexure due to underlying colitis, 5 mm sessile polyp (hyperplastic) to sigmoid colon, exam otherwise normal from ileum to sigmoid, repeat colonoscopy every 4 years advised.  Most recent lab work on file from April 2023 with normal hemoglobin and PT/INR, UA positive for proteins, ketones, nitrates.  CMP normal with creatinine 0.98. (Patient's baseline creatinine appears to be 1-1.6)  Today:  Diarrhea - Since she has been off hydralazine and had her dose of metformin dropped she quit having diarrhea. Has been non magnesium supplements as well. Has been eating Activia yogurt as well.   Constipation - Has IBS and has been having fluctuating bowel movements for a while. Has been having a lower right sided pain that is chronic and intermittent and has gained about 5 pounds in the last few weeks. She does see an improvement in the pain with bowel movements. Stools are balled up like clay. Does have to wipe frequently and has feelings of incomplete emptying. Stools are bristol 1-4 varying in range. She has not had a bowel movement in 4 days and has only taken one dose of miralax. She  is fluid restriction with 1200 ml a day. Has been eating cucumbers and have been eating salads as well. Has been eating burgers and things lately. Has sneezing fits with bread. Reported PCP mentioned celiac testing.   Has been to a doctor in [place] for sciatica and received shots in her back.   Has been having trouble with hemorrhoids at least once a month for years. Sometimes blood in the commode and sometimes just on the stools and on toilet tissue. Uses RxAll cream and uses suppositories only if she feels like she really has too. Has had hemorrhoids since 1972 with her first child. Does protrude from time to time and has to push them back in.   Denies dysphagia. Denies breakthrough reflux symptoms, on omeprazole 20 mg daily. Used to be on prevacid. She reports history of H. Pylori and was treated with antibiotics.    Past Medical History:  Diagnosis Date   Abnormal EKG    Diabetes mellitus without complication ([place])    Gout    Hyperlipidemia    Hypertension     Past Surgical History:  Procedure Laterality Date   APPENDECTOMY  1985   cholecystectomy  1985   TONSILLECTOMY  1957    Current Outpatient Medications  Medication Sig Dispense Refill   acetaminophen (TYLENOL) 325 MG tablet Take 650 mg by mouth every 6 (six) hours as needed.     allopurinol (ZYLOPRIM) 300 MG tablet Take 300 mg by mouth daily.  atorvastatin (LIPITOR) 80 MG tablet Take 80 mg by mouth daily.     carvedilol (COREG) 3.125 MG tablet Take 6.25 mg by mouth 2 (two) times daily.     cholecalciferol (VITAMIN D3) 25 MCG (1000 UNIT) tablet Take 1,000 Units by mouth daily.     empagliflozin (JARDIANCE) 10 MG TABS tablet Take 10 mg by mouth daily.     lisinopril (ZESTRIL) 20 MG tablet TAKE 1 TABLET BY MOUTH DAILY 30 tablet 6   loratadine (CLARITIN) 10 MG tablet Take 10 mg by mouth daily.     magnesium oxide (MAG-OX) 400 (240 Mg) MG tablet Take 1 tablet by mouth 2 (two) times daily.     metFORMIN (GLUCOPHAGE)  1000 MG tablet Take 1,000 mg by mouth 2 (two) times daily with a meal.      omeprazole (PRILOSEC) 20 MG capsule Take 20 mg by mouth daily.      No current facility-administered medications for this visit.    Allergies as of 10/09/2021 - Review Complete 10/09/2021  Allergen Reaction Noted   Amlodipine  06/13/2014   Codeine  08/02/2014   Influenza vaccines Other (See Comments) 04/08/2016    Family History  Problem Relation Age of Onset   Lung cancer Mother    Heart attack Father    Heart disease Father    Diabetes Sister     Social History   Socioeconomic History   Marital status: Widowed    Spouse name: Not on file   Number of children: Not on file   Years of education: Not on file   Highest education level: Not on file  Occupational History   Not on file  Tobacco Use   Smoking status: Never   Smokeless tobacco: Never  Vaping Use   Vaping Use: Never used  Substance and Sexual Activity   Alcohol use: No   Drug use: Not Currently   Sexual activity: Not Currently  Other Topics Concern   Not on file  Social History Narrative   Not on file   Social Determinants of Health   Financial Resource Strain: Not on file  Food Insecurity: Not on file  Transportation Needs: Not on file  Physical Activity: Not on file  Stress: Not on file  Social Connections: Not on file  Intimate Partner Violence: Not on file     Review of Systems   Gen: Denies any fever, chills, fatigue, weight loss, lack of appetite.  CV: Denies chest pain, heart palpitations, peripheral edema, syncope.  Resp: Denies shortness of breath at rest or with exertion. Denies wheezing or cough.  GI: See HPI GU : Denies urinary burning, urinary frequency, urinary hesitancy MS: Denies joint pain, muscle weakness, cramps, or limitation of movement.  Derm: Denies rash, itching, dry skin Psych: Denies depression, anxiety, memory loss, and confusion Heme: Denies bruising, bleeding, and enlarged lymph  nodes.   Physical Exam   BP (!) 166/82 (BP Location: Left Arm, Patient Position: Sitting, Cuff Size: Normal)   Pulse 78   Temp 97.7 F (36.5 C) (Temporal)   Ht 5' 2.5" (1.588 m)   Wt 136 lb 9.6 oz (62 kg)   SpO2 98%   BMI 24.59 kg/m   General:   Alert and oriented. Pleasant and cooperative. Well-nourished and well-developed.  Head:  Normocephalic and atraumatic. Eyes:  Without icterus, sclera clear and conjunctiva pink.  Ears:  Normal auditory acuity. Mouth:  No deformity or lesions, oral mucosa pink.  Lungs:  Clear to auscultation bilaterally. No wheezes,  rales, or rhonchi. No distress.  Heart:  S1, S2 present without murmurs appreciated.  Abdomen:  +BS, soft, non-tender and non-distended. No HSM noted. No guarding or rebound. No masses appreciated.  Rectal:  Deferred  Msk:  Symmetrical without gross deformities. Normal posture. Extremities:  Without edema. Neurologic:  Alert and  oriented x4;  grossly normal neurologically. Skin:  Intact without significant lesions or rashes. Psych:  Alert and cooperative. Normal mood and affect.   Assessment   FLEDA PAGEL is a 71 y.o. female with a history of HLD, HTN, diabetes, CKD stage IIIa diagnosed in 2022, MGUS, B12 deficiency presenting today with complaint of hemorrhoids need to schedule TCS.  History of colon polyp: Last colonoscopy in 2001 with 5 mm hyperplastic polyp in sigmoid colon.  Has had some hematochezia however mostly related to hemorrhoids.  Normal symptoms present.  We will proceed with colonoscopy.  She will need prep with TriLyte due to history of kidney disease.  Hemorrhoids: Patient has flares related to her hemorrhoids about once a month.  She states she has had hemorrhoids since the 1970s after her first child.  They are exacerbated by baseline IBS with constipation which she has been dealing with for years.  She occasionally uses over-the-counter Rexall or suppositories to help with her hemorrhoids.  She states she  has been having issues with her hemorrhoids lately and having some toilet tissue hematochezia with occasional drops in the toilet but usually self resolving.  Discussed evaluating with colonoscopy and possibly pursuing hemorrhoid banding after attempting to have better control of constipation.  IBS/Bloating/abdominal pain: Patient primarily suffers from constipation.  She has not had a bowel movement in 4 days and has only taken 1 dose of MiraLAX recently.  She reported a distant history of diarrhea however that has now resolved after discontinuation of hydralazine and reduction in her metformin dose.  As stated previously she typically deals with constipation and her stools are usually [place] 1 through 4 and are sometimes firm and sometimes soft.  She does admit to incomplete emptying and occasional urgency with the inability to defecate.  She also admits to frequent wiping and difficulty getting herself clean at times.  As stated above she has hemorrhoids related to her constipation.  She only drinks about 2-1/2 bottles of water daily as she is on 1200 cc fluid restriction for her CKD.  She does attempt to have fiber in her diet and has occasionally taken over-the-counter fiber supplements but not on a regular basis.  Patient reports right lower quadrant abdominal pain that is intermittent but chroni  She feels as though this probably is related to her constipation which I agree with given that she does have improvement at times with defecation.  I have advised her to begin taking a clear soluble over-the-counter fiber supplement, 2 to 3 teaspoons daily and to be more consistent with MiraLAX having 17 g daily and can increase to twice daily if needed.  Given prior diarrhea as well as abdominal bloating and possible sneezing episodes that occur after eating foods that contain bread, will check celiac serologies.  Patient and family reported that PCP mention possible colon biopsies to rule out celiac  disease.  GERD: Well-controlled on omeprazole 20 mg daily.  PLAN   Proceed with colonoscopy with propofol by Dr. Gala Romney in near future: the risks, benefits, and alternatives have been discussed with the patient in detail. The patient states understanding and desires to proceed. ASA 3 Prep with TriLyte Hold oral  diabetes medications the night prior to morning of procedure Miralax 17g daily, can increase to twice daily if needed.  Continue over-the-counter hemorrhoid cream Fiber supplement daily.  IgA, Ttg IgA Continue omeprazole 20 mg daily Possible banding candidate in the future after procedure performed. Follow-up 2 months post procedure   Venetia Night, MSN, FNP-BC, AGACNP-BC [facility]

## 2021-10-09 ENCOUNTER — Ambulatory Visit: Payer: Medicare Other | Admitting: Gastroenterology

## 2021-10-09 ENCOUNTER — Encounter: Payer: Self-pay | Admitting: Gastroenterology

## 2021-10-09 ENCOUNTER — Encounter: Payer: Self-pay | Admitting: *Deleted

## 2021-10-09 VITALS — BP 166/82 | HR 78 | Temp 97.7°F | Ht 62.5 in | Wt 136.6 lb

## 2021-10-09 DIAGNOSIS — G8929 Other chronic pain: Secondary | ICD-10-CM

## 2021-10-09 DIAGNOSIS — R1031 Right lower quadrant pain: Secondary | ICD-10-CM | POA: Diagnosis not present

## 2021-10-09 DIAGNOSIS — R140 Abdominal distension (gaseous): Secondary | ICD-10-CM

## 2021-10-09 DIAGNOSIS — K219 Gastro-esophageal reflux disease without esophagitis: Secondary | ICD-10-CM

## 2021-10-09 DIAGNOSIS — K588 Other irritable bowel syndrome: Secondary | ICD-10-CM | POA: Diagnosis not present

## 2021-10-09 DIAGNOSIS — R197 Diarrhea, unspecified: Secondary | ICD-10-CM

## 2021-10-09 DIAGNOSIS — Z86010 Personal history of colonic polyps: Secondary | ICD-10-CM

## 2021-10-09 MED ORDER — PEG 3350-KCL-NA BICARB-NACL 420 G PO SOLR
4000.0000 mL | Freq: Once | ORAL | 0 refills | Status: AC
Start: 2021-10-09 — End: 2021-10-09

## 2021-10-09 NOTE — Patient Instructions (Addendum)
Is a pleasure to meet you today!  For your hemorrhoids:  - Continue to use over-the-counter Rexall cream or suppositories as needed.  - Continue to avoid straining.   - Limit toilet time to 2-3 minutes at the most if at all possible.  - We can do an office banding in the future if desired.  -You may also do sitz bath's, I have attached a handout regarding management of hemorrhoids.  For your constipation:  - I want you to begin taking MiraLAX 17 g (1 capful) daily.  If you do not have good results in the next few days you may increase to twice daily and then titrate once or twice daily dosing based on the consistency of your bowel movements.  -I also want you to begin taking the clear soluble fiber supplement 2 to 3 teaspoons daily.  -Generics of these are fine  I suspect that if we control your constipation that your lower right-sided abdominal pain, bloating, as well as your hemorrhoids may improve.  I suspect that your prior diarrhea episodes were medication related however given your history of sneezing with eating breads and abdominal bloating as well as prior diarrhea we will check celiac labs to rule out gluten allergy.  Even if testing is negative you likely could have gluten sensitivity and could trial elimination diet if desired.  We will schedule you for colonoscopy in the near future with Dr. Gala Romney due to your history of colon polyps as well as prior change in bowel habits. Just as a reminder there is a < 0.1% risk of reaction to medication, bleeding, infection, perforation for the colonoscopy. Feel free to reach out with any questions. You will sign consent the day of the procedure.    It was a pleasure to see you today. I want to create trusting relationships with patients. If you receive a survey regarding your visit,  I greatly appreciate you taking time to fill this out on paper or through your MyChart. I value your feedback.  Venetia Night, MSN, FNP-BC, AGACNP-BC [facility]

## 2021-10-10 ENCOUNTER — Encounter: Payer: Self-pay | Admitting: *Deleted

## 2021-10-11 ENCOUNTER — Telehealth: Payer: Self-pay | Admitting: *Deleted

## 2021-10-11 NOTE — Telephone Encounter (Signed)
Approved via [facility]. Auth# [id number], DOS: Oct 29, 2021 - Jan 27, 2022

## 2021-10-13 LAB — IGA: Immunoglobulin A: 220 mg/dL (ref 70–320)

## 2021-10-13 LAB — TISSUE TRANSGLUTAMINASE, IGA: (tTG) Ab, IgA: <1 U/mL

## 2021-10-23 NOTE — Patient Instructions (Signed)
Angelica Conway  10/23/2021     '@PREFPERIOPPHARMACY'$ @   Your procedure is scheduled on  10/29/2021.   Report to [facility] at  0900  A.M.   Call this number if you have problems the morning of surgery:  [phone]   Remember:  Follow the diet and prep instructions given to you by the office.        DO NOT take any medications for diabetes the morning of your procedure.    Take these medicines the morning of surgery with A SIP OF WATER                                    allopurinol, coreg, prilosec.     Do not wear jewelry, make-up or nail polish.  Do not wear lotions, powders, or perfumes, or deodorant.  Do not shave 48 hours prior to surgery.  Men may shave face and neck.  Do not bring valuables to the hospital.  [facility] is not responsible for any belongings or valuables.  Contacts, dentures or bridgework may not be worn into surgery.  Leave your suitcase in the car.  After surgery it may be brought to your room.  For patients admitted to the hospital, discharge time will be determined by your treatment team.  Patients discharged the day of surgery will not be allowed to drive home and must have someone with them for 24 hours.    Special instructions:   DO NOT smoke tobacco or vape for 24 hours before your procedure.  Please read over the following fact sheets that you were given. Anesthesia Post-op Instructions and Care and Recovery After Surgery      Colonoscopy, Adult, Care After The following information offers guidance on how to care for yourself after your procedure. Your health care provider may also give you more specific instructions. If you have problems or questions, contact your health care provider. What can I expect after the procedure? After the procedure, it is common to have: A small amount of blood in your stool for 24 hours after the procedure. Some gas. Mild cramping or bloating of your abdomen. Follow these instructions at  home: Eating and drinking  Drink enough fluid to keep your urine pale yellow. Follow instructions from your health care provider about eating or drinking restrictions. Resume your normal diet as told by your health care provider. Avoid heavy or fried foods that are hard to digest. Activity Rest as told by your health care provider. Avoid sitting for a long time without moving. Get up to take short walks every 1-2 hours. This is important to improve blood flow and breathing. Ask for help if you feel weak or unsteady. Return to your normal activities as told by your health care provider. Ask your health care provider what activities are safe for you. Managing cramping and bloating  Try walking around when you have cramps or feel bloated. If directed, apply heat to your abdomen as told by your health care provider. Use the heat source that your health care provider recommends, such as a moist heat pack or a heating pad. Place a towel between your skin and the heat source. Leave the heat on for 20-30 minutes. Remove the heat if your skin turns bright red. This is especially important if you are unable to feel pain, heat, or cold. You have a greater risk of  getting burned. General instructions If you were given a sedative during the procedure, it can affect you for several hours. Do not drive or operate machinery until your health care provider says that it is safe. For the first 24 hours after the procedure: Do not sign important documents. Do not drink alcohol. Do your regular daily activities at a slower pace than normal. Eat soft foods that are easy to digest. Take over-the-counter and prescription medicines only as told by your health care provider. Keep all follow-up visits. This is important. Contact a health care provider if: You have blood in your stool 2-3 days after the procedure. Get help right away if: You have more than a small spotting of blood in your stool. You have large  blood clots in your stool. You have swelling of your abdomen. You have nausea or vomiting. You have a fever. You have increasing pain in your abdomen that is not relieved with medicine. These symptoms may be an emergency. Get help right away. Call 911. Do not wait to see if the symptoms will go away. Do not drive yourself to the hospital. Summary After the procedure, it is common to have a small amount of blood in your stool. You may also have mild cramping and bloating of your abdomen. If you were given a sedative during the procedure, it can affect you for several hours. Do not drive or operate machinery until your health care provider says that it is safe. Get help right away if you have a lot of blood in your stool, nausea or vomiting, a fever, or increased pain in your abdomen. This information is not intended to replace advice given to you by your health care provider. Make sure you discuss any questions you have with your health care provider. Document Revised: 10/25/2020 Document Reviewed: 10/25/2020 Elsevier Patient Education  [place] After This sheet gives you information about how to care for yourself after your procedure. Your health care provider may also give you more specific instructions. If you have problems or questions, contact your health care provider. What can I expect after the procedure? After the procedure, it is common to have: Tiredness. Forgetfulness about what happened after the procedure. Impaired judgment for important decisions. Nausea or vomiting. Some difficulty with balance. Follow these instructions at home: For the time period you were told by your health care provider:     Rest as needed. Do not participate in activities where you could fall or become injured. Do not drive or use machinery. Do not drink alcohol. Do not take sleeping pills or medicines that cause drowsiness. Do not make important  decisions or sign legal documents. Do not take care of children on your own. Eating and drinking Follow the diet that is recommended by your health care provider. Drink enough fluid to keep your urine pale yellow. If you vomit: Drink water, juice, or soup when you can drink without vomiting. Make sure you have little or no nausea before eating solid foods. General instructions Have a responsible adult stay with you for the time you are told. It is important to have someone help care for you until you are awake and alert. Take over-the-counter and prescription medicines only as told by your health care provider. If you have sleep apnea, surgery and certain medicines can increase your risk for breathing problems. Follow instructions from your health care provider about wearing your sleep device: Anytime you are sleeping, including during daytime naps.  While taking prescription pain medicines, sleeping medicines, or medicines that make you drowsy. Avoid smoking. Keep all follow-up visits as told by your health care provider. This is important. Contact a health care provider if: You keep feeling nauseous or you keep vomiting. You feel light-headed. You are still sleepy or having trouble with balance after 24 hours. You develop a rash. You have a fever. You have redness or swelling around the IV site. Get help right away if: You have trouble breathing. You have new-onset confusion at home. Summary For several hours after your procedure, you may feel tired. You may also be forgetful and have poor judgment. Have a responsible adult stay with you for the time you are told. It is important to have someone help care for you until you are awake and alert. Rest as told. Do not drive or operate machinery. Do not drink alcohol or take sleeping pills. Get help right away if you have trouble breathing, or if you suddenly become confused. This information is not intended to replace advice given to you  by your health care provider. Make sure you discuss any questions you have with your health care provider. Document Revised: 02/06/2021 Document Reviewed: 02/04/2019 Elsevier Patient Education  [address].

## 2021-10-25 ENCOUNTER — Encounter (HOSPITAL_COMMUNITY): Payer: Self-pay

## 2021-10-25 ENCOUNTER — Encounter (HOSPITAL_COMMUNITY)
Admission: RE | Admit: 2021-10-25 | Discharge: 2021-10-25 | Disposition: A | Discharge status: Home / Self Care | Payer: Medicare Other | Source: Ambulatory Visit | Attending: Internal Medicine | Admitting: Internal Medicine

## 2021-10-25 VITALS — BP 161/86 | HR 73 | Temp 97.7°F | Resp 18 | Ht 62.5 in | Wt 136.6 lb

## 2021-10-25 DIAGNOSIS — Z01812 Encounter for preprocedural laboratory examination: Secondary | ICD-10-CM | POA: Diagnosis not present

## 2021-10-25 DIAGNOSIS — E119 Type 2 diabetes mellitus without complications: Secondary | ICD-10-CM | POA: Diagnosis present

## 2021-10-25 HISTORY — DX: Personal history of other diseases of the musculoskeletal system and connective tissue: Z87.39

## 2021-10-25 HISTORY — DX: Chronic kidney disease, unspecified: N18.9

## 2021-10-25 HISTORY — DX: Unspecified osteoarthritis, unspecified site: M19.90

## 2021-10-25 HISTORY — DX: Anxiety disorder, unspecified: F41.9

## 2021-10-25 HISTORY — DX: Other complications of anesthesia, initial encounter: T88.59XA

## 2021-10-25 LAB — BASIC METABOLIC PANEL
Anion gap: 8 (ref 5–15)
BUN: 22 mg/dL (ref 8–23)
CO2: 23 mmol/L (ref 22–32)
Calcium: 9.6 mg/dL (ref 8.9–10.3)
Chloride: 107 mmol/L (ref 98–111)
Creatinine, Ser: 1.09 mg/dL — ABNORMAL HIGH (ref 0.44–1.00)
GFR, Estimated: 54 mL/min — ABNORMAL LOW (ref >60)
Glucose, Bld: 77 mg/dL (ref 70–99)
Potassium: 5 mmol/L (ref 3.5–5.1)
Sodium: 138 mmol/L (ref 135–145)

## 2021-10-26 ENCOUNTER — Encounter: Payer: Self-pay | Admitting: *Deleted

## 2021-10-29 ENCOUNTER — Ambulatory Visit (HOSPITAL_COMMUNITY)
Admission: RE | Admit: 2021-10-29 | Discharge: 2021-10-29 | Disposition: A | Discharge status: Home / Self Care | Payer: Medicare Other | Attending: Internal Medicine | Admitting: Internal Medicine

## 2021-10-29 ENCOUNTER — Other Ambulatory Visit: Payer: Self-pay

## 2021-10-29 ENCOUNTER — Encounter (HOSPITAL_COMMUNITY)
Admission: RE | Disposition: A | Discharge status: Home / Self Care | Payer: Self-pay | Source: Home / Self Care | Attending: Internal Medicine

## 2021-10-29 ENCOUNTER — Ambulatory Visit (HOSPITAL_BASED_OUTPATIENT_CLINIC_OR_DEPARTMENT_OTHER): Payer: Medicare Other | Admitting: Anesthesiology

## 2021-10-29 ENCOUNTER — Ambulatory Visit (HOSPITAL_COMMUNITY): Payer: Medicare Other | Admitting: Anesthesiology

## 2021-10-29 ENCOUNTER — Encounter (HOSPITAL_COMMUNITY): Payer: Self-pay

## 2021-10-29 DIAGNOSIS — Z1211 Encounter for screening for malignant neoplasm of colon: Secondary | ICD-10-CM

## 2021-10-29 DIAGNOSIS — K644 Residual hemorrhoidal skin tags: Secondary | ICD-10-CM | POA: Diagnosis not present

## 2021-10-29 DIAGNOSIS — E538 Deficiency of other specified B group vitamins: Secondary | ICD-10-CM | POA: Insufficient documentation

## 2021-10-29 DIAGNOSIS — D124 Benign neoplasm of descending colon: Secondary | ICD-10-CM | POA: Insufficient documentation

## 2021-10-29 DIAGNOSIS — I10 Essential (primary) hypertension: Secondary | ICD-10-CM | POA: Diagnosis not present

## 2021-10-29 DIAGNOSIS — K580 Irritable bowel syndrome with diarrhea: Secondary | ICD-10-CM | POA: Diagnosis not present

## 2021-10-29 DIAGNOSIS — E1122 Type 2 diabetes mellitus with diabetic chronic kidney disease: Secondary | ICD-10-CM | POA: Diagnosis not present

## 2021-10-29 DIAGNOSIS — Z7984 Long term (current) use of oral hypoglycemic drugs: Secondary | ICD-10-CM | POA: Diagnosis not present

## 2021-10-29 DIAGNOSIS — K2100 Gastro-esophageal reflux disease with esophagitis, without bleeding: Secondary | ICD-10-CM | POA: Diagnosis not present

## 2021-10-29 DIAGNOSIS — Z8619 Personal history of other infectious and parasitic diseases: Secondary | ICD-10-CM | POA: Diagnosis not present

## 2021-10-29 DIAGNOSIS — Z79899 Other long term (current) drug therapy: Secondary | ICD-10-CM | POA: Insufficient documentation

## 2021-10-29 DIAGNOSIS — E785 Hyperlipidemia, unspecified: Secondary | ICD-10-CM | POA: Diagnosis not present

## 2021-10-29 DIAGNOSIS — G8929 Other chronic pain: Secondary | ICD-10-CM | POA: Diagnosis not present

## 2021-10-29 DIAGNOSIS — Z1212 Encounter for screening for malignant neoplasm of rectum: Secondary | ICD-10-CM

## 2021-10-29 DIAGNOSIS — K635 Polyp of colon: Secondary | ICD-10-CM

## 2021-10-29 DIAGNOSIS — K648 Other hemorrhoids: Secondary | ICD-10-CM | POA: Insufficient documentation

## 2021-10-29 DIAGNOSIS — I129 Hypertensive chronic kidney disease with stage 1 through stage 4 chronic kidney disease, or unspecified chronic kidney disease: Secondary | ICD-10-CM | POA: Insufficient documentation

## 2021-10-29 DIAGNOSIS — N1831 Chronic kidney disease, stage 3a: Secondary | ICD-10-CM | POA: Insufficient documentation

## 2021-10-29 DIAGNOSIS — R197 Diarrhea, unspecified: Secondary | ICD-10-CM

## 2021-10-29 DIAGNOSIS — K449 Diaphragmatic hernia without obstruction or gangrene: Secondary | ICD-10-CM | POA: Insufficient documentation

## 2021-10-29 DIAGNOSIS — Z86010 Personal history of colonic polyps: Secondary | ICD-10-CM

## 2021-10-29 HISTORY — PX: POLYPECTOMY: SHX5525

## 2021-10-29 HISTORY — PX: COLONOSCOPY WITH PROPOFOL: SHX5780

## 2021-10-29 LAB — GLUCOSE, CAPILLARY: Glucose-Capillary: 91 mg/dL (ref 70–99)

## 2021-10-29 SURGERY — COLONOSCOPY WITH PROPOFOL
Anesthesia: General

## 2021-10-29 MED ORDER — LACTATED RINGERS IV SOLN
INTRAVENOUS | Status: DC
Start: 2021-10-29 — End: 2021-10-29

## 2021-10-29 MED ORDER — PROPOFOL 10 MG/ML IV BOLUS
INTRAVENOUS | Status: DC | PRN
  Administered 2021-10-29: 20 mg via INTRAVENOUS
  Administered 2021-10-29: 30 mg via INTRAVENOUS
  Administered 2021-10-29: 80 mg via INTRAVENOUS
  Administered 2021-10-29: 30 mg via INTRAVENOUS
  Administered 2021-10-29: 20 mg via INTRAVENOUS

## 2021-10-29 NOTE — Interval H&P Note (Signed)
History and Physical Interval Note:  10/29/2021 10:25 AM  Angelica Conway  has presented today for surgery, with the diagnosis of hx colon polyps, diarrhea, bloating, RLQ pain, constipation, hemorrhoids.  The various methods of treatment have been discussed with the patient and family. After consideration of risks, benefits and other options for treatment, the patient has consented to  Procedure(s) with comments: COLONOSCOPY WITH PROPOFOL (N/A) - 11:00AM, ASA 3 as a surgical intervention.  The patient's history has been reviewed, patient examined, no change in status, stable for surgery.  I have reviewed the patient's chart and labs.  Questions were answered to the patient's satisfaction.     Eloise Harman

## 2021-10-29 NOTE — Anesthesia Postprocedure Evaluation (Signed)
Anesthesia Post Note  Patient: Angelica Conway  Procedure(s) Performed: COLONOSCOPY WITH PROPOFOL POLYPECTOMY  Patient location during evaluation: Phase II Anesthesia Type: General Level of consciousness: awake and alert and oriented Pain management: pain level controlled Vital Signs Assessment: post-procedure vital signs reviewed and stable Respiratory status: spontaneous breathing, nonlabored ventilation and respiratory function stable Cardiovascular status: blood pressure returned to baseline and stable Postop Assessment: no apparent nausea or vomiting Anesthetic complications: no   No notable events documented.   Last Vitals:  Vitals:   10/29/21 1010 10/29/21 1048  BP: (!) 191/95 (!) 132/58  Pulse: 68 66  Resp: 15 16  Temp: 36.6 C (!) 36.4 C  SpO2: 100% 97%    Last Pain:  Vitals:   10/29/21 1048  TempSrc:   PainSc: 0-No pain                 Briza Bark C Kendrick Remigio

## 2021-10-29 NOTE — Transfer of Care (Signed)
Immediate Anesthesia Transfer of Care Note  Patient: Angelica Conway  Procedure(s) Performed: COLONOSCOPY WITH PROPOFOL POLYPECTOMY  Patient Location: Short Stay  Anesthesia Type:General  Level of Consciousness: drowsy  Airway & Oxygen Therapy: Patient Spontanous Breathing  Post-op Assessment: Report given to RN and Post -op Vital signs reviewed and stable  Post vital signs: Reviewed and stable  Last Vitals:  Vitals Value Taken Time  BP 132/58 10/29/21 1048  Temp 36.4 C 10/29/21 1048  Pulse 66 10/29/21 1048  Resp 16 10/29/21 1048  SpO2 97 % 10/29/21 1048    Last Pain:  Vitals:   10/29/21 1048  TempSrc:   PainSc: 0-No pain      Patients Stated Pain Goal: 3 (17/61/60 7371)  Complications: No notable events documented.

## 2021-10-29 NOTE — Anesthesia Preprocedure Evaluation (Signed)
Anesthesia Evaluation  Patient identified by MRN, date of birth, ID band Patient awake    Reviewed: Allergy & Precautions, NPO status , Patient's Chart, lab work & pertinent test results, reviewed documented beta blocker date and time   History of Anesthesia Complications (+) history of anesthetic complications  Airway Mallampati: II  TM Distance: >3 FB Neck ROM: Full    Dental  (+) Dental Advisory Given, Edentulous Upper, Missing   Pulmonary neg pulmonary ROS,    Pulmonary exam normal breath sounds clear to auscultation       Cardiovascular Exercise Tolerance: Poor hypertension, Pt. on medications and Pt. on home beta blockers Normal cardiovascular exam Rhythm:Regular Rate:Normal     Neuro/Psych PSYCHIATRIC DISORDERS Anxiety negative neurological ROS     GI/Hepatic negative GI ROS, Neg liver ROS,   Endo/Other  diabetes, Well Controlled, Type 2, Oral Hypoglycemic Agents  Renal/GU Renal InsufficiencyRenal disease  negative genitourinary   Musculoskeletal  (+) Arthritis , Osteoarthritis,    Abdominal   Peds negative pediatric ROS (+)  Hematology negative hematology ROS (+)   Anesthesia Other Findings   Reproductive/Obstetrics negative OB ROS                            Anesthesia Physical Anesthesia Plan  ASA: 3  Anesthesia Plan: General   Post-op Pain Management: Minimal or no pain anticipated   Induction:   PONV Risk Score and Plan: Propofol infusion  Airway Management Planned: Nasal Cannula and Natural Airway  Additional Equipment:   Intra-op Plan:   Post-operative Plan:   Informed Consent: I have reviewed the patients History and Physical, chart, labs and discussed the procedure including the risks, benefits and alternatives for the proposed anesthesia with the patient or authorized representative who has indicated his/her understanding and acceptance.     Dental  advisory given  Plan Discussed with: CRNA and Surgeon  Anesthesia Plan Comments:        Anesthesia Quick Evaluation

## 2021-10-29 NOTE — Discharge Instructions (Signed)
  Colonoscopy Discharge Instructions  Read the instructions outlined below and refer to this sheet in the next few weeks. These discharge instructions provide you with general information on caring for yourself after you leave the hospital. Your doctor may also give you specific instructions. While your treatment has been planned according to the most current medical practices available, unavoidable complications occasionally occur.   ACTIVITY You may resume your regular activity, but move at a slower pace for the next 24 hours.  Take frequent rest periods for the next 24 hours.  Walking will help get rid of the air and reduce the bloated feeling in your belly (abdomen).  No driving for 24 hours (because of the medicine (anesthesia) used during the test).   Do not sign any important legal documents or operate any machinery for 24 hours (because of the anesthesia used during the test).  NUTRITION Drink plenty of fluids.  You may resume your normal diet as instructed by your doctor.  Begin with a light meal and progress to your normal diet. Heavy or fried foods are harder to digest and may make you feel sick to your stomach (nauseated).  Avoid alcoholic beverages for 24 hours or as instructed.  MEDICATIONS You may resume your normal medications unless your doctor tells you otherwise.  WHAT YOU CAN EXPECT TODAY Some feelings of bloating in the abdomen.  Passage of more gas than usual.  Spotting of blood in your stool or on the toilet paper.  IF YOU HAD POLYPS REMOVED DURING THE COLONOSCOPY: No aspirin products for 7 days or as instructed.  No alcohol for 7 days or as instructed.  Eat a soft diet for the next 24 hours.  FINDING OUT THE RESULTS OF YOUR TEST Not all test results are available during your visit. If your test results are not back during the visit, make an appointment with your caregiver to find out the results. Do not assume everything is normal if you have not heard from your  caregiver or the medical facility. It is important for you to follow up on all of your test results.  SEEK IMMEDIATE MEDICAL ATTENTION IF: You have more than a spotting of blood in your stool.  Your belly is swollen (abdominal distention).  You are nauseated or vomiting.  You have a temperature over 101.  You have abdominal pain or discomfort that is severe or gets worse throughout the day.   Your colonoscopy revealed 1 polyp(s) which I removed successfully. Await pathology results, my office will contact you. I recommend repeating colonoscopy in 5-10 years for surveillance purposes depending on pathology results.  You do have extensive external and internal hemorrhoids.   I would recommend increasing fiber in your diet or adding OTC Benefiber/Metamucil. Be sure to drink at least 4 to 6 glasses of water daily.   Follow-up with GI in 3 months.  Consider hemorrhoid banding for internal hemorrhoids if having bleeding.    I hope you have a great rest of your week!  Elon Alas. Abbey Chatters, D.O. Gastroenterology and Hepatology [facility]

## 2021-10-29 NOTE — Op Note (Signed)
[facility] Patient Name: Angelica Conway Procedure Date: 10/29/2021 10:22 AM MRN: [id number] Date of Birth: 11/16/1950 Attending MD: Elon Alas. Edgar Frisk CSN: [id number] Age: 71 Admit Type: Outpatient Procedure:                Colonoscopy Indications:              Screening for colorectal malignant neoplasm Providers:                Elon Alas. Abbey Chatters, DO, Janeece Riggers, RN, Charlsie Quest                            Theda Sers RN, RN, Everardo Pacific, Aram Candela Referring MD:              Medicines:                See the Anesthesia note for documentation of the                            administered medications Complications:            No immediate complications. Estimated Blood Loss:     Estimated blood loss was minimal. Procedure:                Pre-Anesthesia Assessment:                           - The anesthesia plan was to use monitored                            anesthesia care (MAC).                           After obtaining informed consent, the colonoscope                            was passed under direct vision. Throughout the                            procedure, the patient's blood pressure, pulse, and                            oxygen saturations were monitored continuously. The                            [id number]-HQ190L ([id number]) scope was introduced through                            the anus and advanced to the the cecum, identified                            by appendiceal orifice and ileocecal valve. The                            colonoscopy was performed without difficulty. The  patient tolerated the procedure well. The quality                            of the bowel preparation was evaluated using the                            BBPS [facility] Bowel Preparation Scale) with scores                            of: Right Colon = 3, Transverse Colon = 3 and Left                            Colon = 3 (entire mucosa seen well with no residual                             staining, small fragments of stool or opaque                            liquid). The total BBPS score equals 9. Scope In: 10:35:18 AM Scope Out: 10:46:31 AM Scope Withdrawal Time: 0 hours 8 minutes 17 seconds  Total Procedure Duration: 0 hours 11 minutes 13 seconds  Findings:      Hemorrhoids were found on perianal exam.      Non-bleeding internal hemorrhoids were found during retroflexion.      A 5 mm polyp was found in the descending colon. The polyp was sessile.       The polyp was removed with a cold snare. Resection and retrieval were       complete.      The exam was otherwise without abnormality. Impression:               - Hemorrhoids found on perianal exam.                           - Non-bleeding internal hemorrhoids.                           - One 5 mm polyp in the descending colon, removed                            with a cold snare. Resected and retrieved.                           - The examination was otherwise normal. Moderate Sedation:      Per Anesthesia Care Recommendation:           - Patient has a contact number available for                            emergencies. The signs and symptoms of potential                            delayed complications were discussed with the  patient. Return to normal activities tomorrow.                            Written discharge instructions were provided to the                            patient.                           - Resume previous diet.                           - Continue present medications.                           - Await pathology results.                           - Repeat colonoscopy in 5 years for surveillance.                           - Return to GI clinic in 3 months.                           - Patient with extensive external and internal                            hemorrhoids. Consider banding of internal                            hemorrhoids if  bleeding. Procedure Code(s):        --- Professional ---                           [phone], Colonoscopy, flexible; with removal of                            tumor(s), polyp(s), or other lesion(s) by snare                            technique Diagnosis Code(s):        --- Professional ---                           Z12.11, Encounter for screening for malignant                            neoplasm of colon                           K64.8, Other hemorrhoids                           K63.5, Polyp of colon CPT copyright 2019 American Medical Association. All rights reserved. The codes documented in this report are preliminary and upon coder review may  be revised to meet current compliance requirements. Elon Alas. Abbey Chatters, DO [place], DO 10/29/2021  10:50:15 AM This report has been signed electronically. Number of Addenda: 0

## 2021-10-30 LAB — SURGICAL PATHOLOGY

## 2021-11-01 ENCOUNTER — Encounter (HOSPITAL_COMMUNITY): Payer: Self-pay | Admitting: Internal Medicine

## 2021-12-14 ENCOUNTER — Telehealth: Payer: Self-pay | Admitting: Cardiology

## 2021-12-14 NOTE — Telephone Encounter (Signed)
STAT if patient feels like he/she is going to faint   Are you dizzy now? No, her Kidney doctor suggested she give [place] a call because of the episodes she has had.    Do you feel faint or have you passed out? Daughter in law states patient has passed out in the past. She has had sycopal episodes, this has happened about three times in the past two months.   Do you have any other symptoms? She can't speak, brain is cloudy.  Is she has passed out for 3-5 mins.  EMS has been called when this happens.   Have you checked your HR and BP (record if available)? Afterwards it was checked 195/87, HR 74.

## 2021-12-14 NOTE — Telephone Encounter (Signed)
Left message to return call 

## 2021-12-14 NOTE — Telephone Encounter (Signed)
Spoke with daughter in law Environmental education officer) - states that she saw Dr. Theador Hawthorne & he wanted her seen back in office to evaluate her syncopal episodes.  States that she has been to ED several times and they have thought may have been vasovagal because typically happens when she is coming out of restroom.  States that she does get really foggy and can't speak just prior to the syncopal episodes.  She is no longer on Hydralazine.  She is on Coreg 3.'125mg'$  twice a day per [place], but medication list showing 12.'5mg'$  twice a day.    Appointment scheduled for 12/20/21 with Laurann Montana, NP in our [place] location in [place].

## 2021-12-20 ENCOUNTER — Encounter (HOSPITAL_BASED_OUTPATIENT_CLINIC_OR_DEPARTMENT_OTHER): Payer: Self-pay | Admitting: Family

## 2021-12-20 ENCOUNTER — Telehealth (HOSPITAL_BASED_OUTPATIENT_CLINIC_OR_DEPARTMENT_OTHER): Payer: Self-pay

## 2021-12-20 ENCOUNTER — Other Ambulatory Visit (INDEPENDENT_AMBULATORY_CARE_PROVIDER_SITE_OTHER): Payer: Medicare Other

## 2021-12-20 ENCOUNTER — Ambulatory Visit (HOSPITAL_BASED_OUTPATIENT_CLINIC_OR_DEPARTMENT_OTHER): Payer: Medicare Other | Admitting: Family

## 2021-12-20 VITALS — BP 148/75 | HR 65 | Ht 62.5 in | Wt 141.0 lb

## 2021-12-20 DIAGNOSIS — R55 Syncope and collapse: Secondary | ICD-10-CM

## 2021-12-20 DIAGNOSIS — I10 Essential (primary) hypertension: Secondary | ICD-10-CM | POA: Diagnosis not present

## 2021-12-20 DIAGNOSIS — E782 Mixed hyperlipidemia: Secondary | ICD-10-CM | POA: Diagnosis not present

## 2021-12-20 NOTE — Telephone Encounter (Signed)
LM for pt son to call back to schedule echocardiogram ordered by Laurann Montana, NP at appointment today.

## 2021-12-20 NOTE — Telephone Encounter (Signed)
Pt's daughter-in-law is returning call and is requesting call back.

## 2021-12-20 NOTE — Progress Notes (Signed)
Office Visit    Patient Name: Angelica Conway Date of Encounter: 12/20/2021  PCP:  Skillman, Katherine E, [place]  Cardiologist:  Carlyle Dolly, MD  Advanced Practice Provider:  No care team member to display Electrophysiologist:  None      Chief Complaint    Angelica Conway is a 71 y.o. female presents today for syncope   Past Medical History    Past Medical History:  Diagnosis Date   Abnormal EKG    Anxiety    Arthritis    Chronic kidney disease    stage 3   Complication of anesthesia    difficulty waking up   Diabetes mellitus without complication ([place])    Gout    H/O degenerative disc disease    Hyperlipidemia    Hypertension    Past Surgical History:  Procedure Laterality Date   APPENDECTOMY  1985   cholecystectomy  1985   COLONOSCOPY     COLONOSCOPY WITH PROPOFOL N/A 10/29/2021   Procedure: COLONOSCOPY WITH PROPOFOL;  Surgeon: Eloise Harman, DO;  Location: [facility];  Service: Endoscopy;  Laterality: N/A;  11:00AM, ASA 3   POLYPECTOMY  10/29/2021   Procedure: POLYPECTOMY;  Surgeon: Eloise Harman, DO;  Location: [facility];  Service: Endoscopy;;   TONSILLECTOMY  1957    Allergies  Allergies  Allergen Reactions   Amlodipine     Unknown reaction   Codeine     Unknown reaction   Influenza Vaccines Swelling    Local reaction at site of injection  Can tolerate egg free version    History of Present Illness    Angelica Conway is a 71 y.o. female with a hx of CKD, DM2, HTN, gout, HLD, MGUS, syncope last seen 08/03/21.  Last seen 08/03/21 by Dr. Harl Bowie. Bradycardia improved on reduced dose Lopressor and Diltiazem. Chlorthalidone, Spironolactone previously discontinued due to hyponatremia.   Initial episode of passing out while she was sitting on the commode. Has happened 3 times over the last 1.5 months. Two of the three times were in the past 3 weeks. Son witnessed one episode while she was walking out of the  bathroom and lost consciousness for 3-5 minutes. Reports no prodromal symptoms. Has had some hypotensive readings at home. She eats breakfast around 7:30 AM, lunch midday, and dinner. Does have difficulties with constipation. She restricts 1,200 mL fluid intake per day. Reports no amaurosis fugax.  BP via home log: 12/16/2021 134/74 12/17/2021 152/77 12/18/2021 126/72 12/19/2021 126/58 12/20/2021 148/75.  EKGs/Labs/Other Studies Reviewed:   The following studies were reviewed today:   EKG:  EKG is not ordered today. She reports recent EKG at [facility] was unremarkable. We have requested copy for our review.   Recent Labs: 10/25/2021: BUN 22; Creatinine, Ser 1.09; Potassium 5.0; Sodium 138  Recent Lipid Panel No results found for: "CHOL", "TRIG", "HDL", "CHOLHDL", "VLDL", "LDLCALC", "LDLDIRECT"    Home Medications   Current Meds  Medication Sig   acetaminophen (TYLENOL) 325 MG tablet Take 650 mg by mouth every 6 (six) hours as needed for moderate pain.   allopurinol (ZYLOPRIM) 100 MG tablet Take 100 mg by mouth daily.   atorvastatin (LIPITOR) 80 MG tablet Take 80 mg by mouth daily.   carvedilol (COREG) 12.5 MG tablet Take 12.5 mg by mouth 2 (two) times daily.   cholecalciferol (VITAMIN D3) 25 MCG (1000 UNIT) tablet Take 1,000 Units by mouth daily.   cyanocobalamin (,VITAMIN B-12,)  1000 MCG/ML injection Inject 1,000 mcg into the muscle every Friday.   empagliflozin (JARDIANCE) 10 MG TABS tablet Take 10 mg by mouth daily.   escitalopram (LEXAPRO) 5 MG tablet Take 5 mg by mouth daily.   lisinopril (ZESTRIL) 20 MG tablet TAKE 1 TABLET BY MOUTH DAILY   loratadine (CLARITIN) 10 MG tablet Take 10 mg by mouth daily.   magnesium oxide (MAG-OX) 400 (240 Mg) MG tablet Take 400 mg by mouth 2 (two) times daily.   metFORMIN (GLUCOPHAGE) 1000 MG tablet Take 1,000 mg by mouth daily with breakfast.   omeprazole (PRILOSEC) 20 MG capsule Take 20 mg by mouth daily.    polyethylene glycol (MIRALAX /  GLYCOLAX) 17 g packet Take 17 g by mouth daily as needed for moderate constipation.     Review of Systems      All other systems reviewed and are otherwise negative except as noted above.  Physical Exam    VS:  BP (!) 185/84   Pulse 65   Ht 5' 2.5" (1.588 m)   Wt 141 lb (64 kg)   BMI 25.38 kg/m  , BMI Body mass index is 25.38 kg/m.  Wt Readings from Last 3 Encounters:  12/20/21 141 lb (64 kg)  10/25/21 136 lb 9.6 oz (62 kg)  10/09/21 136 lb 9.6 oz (62 kg)    GEN: Well nourished, well developed, in no acute distress. HEENT: normal. Neck: Supple, no JVD, carotid bruits, or masses. Cardiac: RRR, no murmurs, rubs, or gallops. No clubbing, cyanosis, edema.  Radials/PT 2+ and equal bilaterally.  Respiratory:  Respirations regular and unlabored, clear to auscultation bilaterally. GI: Soft, nontender, nondistended. MS: No deformity or atrophy. Skin: Warm and dry, no rash. Neuro:  Strength and sensation are intact. Psych: Normal affect.  Assessment & Plan    Syncope - 3 episodes over last 1.5 months. Most often c/w vasovagal - education provided. Education provided on orthostatic precautions: Stay well hydrated, eat regular meals, wear compression socks, with position changes slowly. 14 day ZIO placed in clinic and echocardiogram ordered to rule out arrhythmia or valvular abnormality as contributory. No amaurosis fugax nor bruit suggestive of carotid stenosis.   HTN - Slightly above goal of 130/80. Given recent syncope, will defer medication changes at this time and address at follow up. Discussed to monitor BP at home at least 2 hours after medications and sitting for 5-10 minutes.   HLD - Continue Lipitor per PCP.  Bradycardia - Resolved with reduced dose Carvedilol and discontinuation of Clonidine.   Hyponatremia - Resolved with discontinuation of Chlorthalidone and Spironolactone.   DM2 - Continue to follow with PCP.     Disposition: Follow up  as scheduled  with Carlyle Dolly, MD or APP.  Signed, Loel Dubonnet, NP 12/20/2021, 4:41 PM [facility]

## 2021-12-20 NOTE — Patient Instructions (Signed)
Medication Instructions:  Your physician recommends that you continue on your current medications as directed. Please refer to the Current Medication list given to you today.  *If you need a refill on your cardiac medications before your next appointment, please call your pharmacy*   Lab Work: None  If you have labs (blood work) drawn today and your tests are completely normal, you will receive your results only by: [place] (if you have MyChart) OR A paper copy in the mail If you have any lab test that is abnormal or we need to change your treatment, we will call you to review the results.   Testing/Procedures: Your physician has recommended that you wear a Zio AT Live monitor for 14 days. Remove on 01/03/22.   This monitor is a medical device that records the heart's electrical activity. Doctors most often use these monitors to diagnose arrhythmias. Arrhythmias are problems with the speed or rhythm of the heartbeat. The monitor is a small device applied to your chest. You can wear one while you do your normal daily activities. While wearing this monitor if you have any symptoms to push the button and record what you felt. Once you have worn this monitor for the period of time provider prescribed (Usually 14 days), you will return the monitor device in the postage paid box/bag. Once it is returned they will download the data collected and provide [place] with a report which the provider will then review and we will call you with those results.   Important tips:  Avoid showering during the first 24 hours of wearing the monitor. Avoid excessive sweating to help maximize wear time. Do not submerge the device, no hot tubs, and no swimming pools. Keep any lotions or oils away from the patch. After 24 hours you may shower with the patch on. Take brief showers with your back facing the shower head.  Do not remove patch once it has been placed because that will interrupt data and decrease  adhesive wear time. Push the button when you have any symptoms and write down what you were feeling. Once you have completed wearing your monitor, remove and place into box which has postage paid and place in your outgoing mailbox.  If for some reason you have misplaced your box then call our office and we can provide another box and/or mail it off for you. Keep the transmitter within 10 feet at all times.  Expect a welcome phone call within 24-23 hrs of application from [place].  This call will include your copay information, so please answer any unknown phone calls while wearing Zio (it could also be important information about your heart) The envelope to return Zio is in the back of the transmitter. Removal instructions are on the last page of the symptom diary.  Place the patch sticky side up inside the transmitter and the symptom diary inside the envelope to return on your last wear day inside your mailbox or any USPS mailbox.    Follow-Up: At [facility], you and your health needs are our priority.  As part of our continuing mission to provide you with exceptional heart care, we have created designated Provider Care Teams.  These Care Teams include your primary Cardiologist (physician) and Advanced Practice Providers (APPs -  Physician Assistants and Nurse Practitioners) who all work together to provide you with the care you need, when you need it.  We recommend signing up for the patient portal called "MyChart".  Sign up information  is provided on this After Visit Summary.  MyChart is used to connect with patients for Virtual Visits (Telemedicine).  Patients are able to view lab/test results, encounter notes, upcoming appointments, etc.  Non-urgent messages can be sent to your provider as well.   To learn more about what you can do with MyChart, go to [email or web address].    Your next appointment:   As scheduled  Other Instructions  Important Information About  [place]

## 2021-12-21 ENCOUNTER — Encounter (HOSPITAL_BASED_OUTPATIENT_CLINIC_OR_DEPARTMENT_OTHER): Payer: Self-pay | Admitting: Family

## 2021-12-21 NOTE — Telephone Encounter (Signed)
Scheduling team going to schedule patient

## 2021-12-21 NOTE — Telephone Encounter (Signed)
Left vm to pt's daughter-in-law to schedule echo after speaking with Lovena Le, RMA.

## 2021-12-31 ENCOUNTER — Telehealth (HOSPITAL_BASED_OUTPATIENT_CLINIC_OR_DEPARTMENT_OTHER): Payer: Self-pay

## 2021-12-31 ENCOUNTER — Ambulatory Visit: Payer: Medicare Other | Attending: Family

## 2021-12-31 DIAGNOSIS — R55 Syncope and collapse: Secondary | ICD-10-CM | POA: Diagnosis not present

## 2021-12-31 LAB — ECHOCARDIOGRAM COMPLETE
AR max vel: 1.29 cm2
AV Peak grad: 5.2 mmHg
AV Vena cont: 0.37 cm
Ao pk vel: 1.15 m/s
Area-P 1/2: 3.42 cm2
Calc EF: 68.1 %
MV M vel: 4.77 m/s
MV Peak grad: 90.8 mmHg
MV Vena cont: 0.17 cm
P 1/2 time: 917 msec
Radius: 0.35 cm
S' Lateral: 2.48 cm
Single Plane A2C EF: 69.1 %
Single Plane A4C EF: 64.9 %

## 2021-12-31 NOTE — Telephone Encounter (Addendum)
Results called to patient who verbalizes understanding!     ----- Message from Loel Dubonnet, NP sent at 12/31/2021  4:20 PM EDT ----- Echo with normal heart pumping function. Heart muscle mildly stiff. Mild leaking of the aortic valve. Mild to moderate leaking of the mitral valve. We prevent from worsening by keeping blood pressure well controlled. No significant abnormality that would cause syncope.

## 2022-01-10 NOTE — Patient Instructions (Signed)
[facility]  Discharge Instructions  You were seen and examined today by Dr. Delton Coombes. Dr. Delton Coombes is a hematologist, meaning that he specializes in blood abnormalities. Dr. Delton Coombes discussed your past medical history, family history of cancers/blood conditions and the events that led to you being here today.  You were referred to Dr. Delton Coombes by Dr. Theador Hawthorne due to an abnormal protein presence in your blood. This is often associated with a condition known as Monoclonal Gammopathy of Unknown Significance (MGUS). This is a non-cancerous condition that simply requires monitoring.  Dr. Delton Coombes has recommended additional labs today for further evaluation.  Follow-up as scheduled.  Thank you for choosing [facility] to provide your oncology and hematology care.   To afford each patient quality time with our provider, please arrive at least 15 minutes before your scheduled appointment time. You may need to reschedule your appointment if you arrive late (10 or more minutes). Arriving late affects you and other patients whose appointments are after yours.  Also, if you miss three or more appointments without notifying the office, you may be dismissed from the clinic at the provider's discretion.    Again, thank you for choosing [facility].  Our hope is that these requests will decrease the amount of time that you wait before being seen by our physicians.   If you have a lab appointment with the [facility] please come in thru the Main Entrance and check in at the main information desk.           _____________________________________________________________  Should you have questions after your visit to [facility], please contact our office at [phone] and follow the prompts.  Our office hours are 8:00 a.m. to 4:30 p.m. Monday - Thursday and 8:00 a.m. to 2:30 p.m. Friday.  Please note that voicemails  left after 4:00 p.m. may not be returned until the following business day.  We are closed weekends and all major holidays.  You do have access to a nurse 24-7, just call the main number to the clinic [phone] and do not press any options, hold on the line and a nurse will answer the phone.    For prescription refill requests, have your pharmacy contact our office and allow 72 hours.    Masks are optional in the cancer centers. If you would like for your care team to wear a mask while they are taking care of you, please let them know. You may have one support person who is at least 71 years old accompany you for your appointments.

## 2022-01-11 ENCOUNTER — Encounter: Payer: Self-pay | Admitting: Hematology

## 2022-01-11 ENCOUNTER — Inpatient Hospital Stay: Payer: Medicare Other

## 2022-01-11 ENCOUNTER — Inpatient Hospital Stay: Payer: Medicare Other | Attending: Hematology | Admitting: Hematology

## 2022-01-11 ENCOUNTER — Ambulatory Visit (HOSPITAL_COMMUNITY)
Admission: RE | Admit: 2022-01-11 | Discharge: 2022-01-11 | Disposition: A | Discharge status: Home / Self Care | Payer: Medicare Other | Source: Ambulatory Visit | Attending: Hematology | Admitting: Hematology

## 2022-01-11 VITALS — BP 113/61 | HR 73 | Temp 97.9°F | Resp 18 | Ht 62.0 in | Wt 142.0 lb

## 2022-01-11 DIAGNOSIS — E1122 Type 2 diabetes mellitus with diabetic chronic kidney disease: Secondary | ICD-10-CM | POA: Insufficient documentation

## 2022-01-11 DIAGNOSIS — R809 Proteinuria, unspecified: Secondary | ICD-10-CM | POA: Insufficient documentation

## 2022-01-11 DIAGNOSIS — D472 Monoclonal gammopathy: Secondary | ICD-10-CM

## 2022-01-11 DIAGNOSIS — Z801 Family history of malignant neoplasm of trachea, bronchus and lung: Secondary | ICD-10-CM | POA: Insufficient documentation

## 2022-01-11 DIAGNOSIS — N183 Chronic kidney disease, stage 3 unspecified: Secondary | ICD-10-CM | POA: Diagnosis not present

## 2022-01-11 DIAGNOSIS — I129 Hypertensive chronic kidney disease with stage 1 through stage 4 chronic kidney disease, or unspecified chronic kidney disease: Secondary | ICD-10-CM | POA: Insufficient documentation

## 2022-01-11 DIAGNOSIS — Z8049 Family history of malignant neoplasm of other genital organs: Secondary | ICD-10-CM | POA: Diagnosis not present

## 2022-01-11 LAB — CBC WITH DIFFERENTIAL/PLATELET
Abs Immature Granulocytes: 0.03 10*3/uL (ref 0.00–0.07)
Basophils Absolute: 0.1 10*3/uL (ref 0.0–0.1)
Basophils Relative: 1 %
Eosinophils Absolute: 0.1 10*3/uL (ref 0.0–0.5)
Eosinophils Relative: 1 %
HCT: 38.4 % (ref 36.0–46.0)
Hemoglobin: 12 g/dL (ref 12.0–15.0)
Immature Granulocytes: 0 %
Lymphocytes Relative: 24 %
Lymphs Abs: 2.1 10*3/uL (ref 0.7–4.0)
MCH: 30 pg (ref 26.0–34.0)
MCHC: 31.3 g/dL (ref 30.0–36.0)
MCV: 96 fL (ref 80.0–100.0)
Monocytes Absolute: 0.5 10*3/uL (ref 0.1–1.0)
Monocytes Relative: 6 %
Neutro Abs: 6 10*3/uL (ref 1.7–7.7)
Neutrophils Relative %: 68 %
Platelets: 210 10*3/uL (ref 150–400)
RBC: 4 MIL/uL (ref 3.87–5.11)
RDW: 13.6 % (ref 11.5–15.5)
WBC: 8.7 10*3/uL (ref 4.0–10.5)
nRBC: 0 % (ref 0.0–0.2)

## 2022-01-11 LAB — COMPREHENSIVE METABOLIC PANEL
ALT: 28 U/L (ref 0–44)
AST: 33 U/L (ref 15–41)
Albumin: 3.7 g/dL (ref 3.5–5.0)
Alkaline Phosphatase: 68 U/L (ref 38–126)
Anion gap: 12 (ref 5–15)
BUN: 33 mg/dL — ABNORMAL HIGH (ref 8–23)
CO2: 21 mmol/L — ABNORMAL LOW (ref 22–32)
Calcium: 9.8 mg/dL (ref 8.9–10.3)
Chloride: 107 mmol/L (ref 98–111)
Creatinine, Ser: 1.22 mg/dL — ABNORMAL HIGH (ref 0.44–1.00)
GFR, Estimated: 47 mL/min — ABNORMAL LOW (ref >60)
Glucose, Bld: 126 mg/dL — ABNORMAL HIGH (ref 70–99)
Potassium: 4.2 mmol/L (ref 3.5–5.1)
Sodium: 140 mmol/L (ref 135–145)
Total Bilirubin: 0.4 mg/dL (ref 0.3–1.2)
Total Protein: 7.3 g/dL (ref 6.5–8.1)

## 2022-01-11 LAB — LACTATE DEHYDROGENASE: LDH: 161 U/L (ref 98–192)

## 2022-01-11 NOTE — Progress Notes (Signed)
CONSULT NOTE  Patient Care Team: Rosine Door as PCP - General (Physician Assistant) Harl Bowie Alphonse Guild, MD as PCP - Cardiology (Cardiology) Gala Romney Cristopher Estimable, MD as Consulting Physician (Gastroenterology)  CHIEF COMPLAINTS/PURPOSE OF CONSULTATION:  IgM lambda monoclonal gammopathy  HISTORY OF PRESENTING ILLNESS:  Angelica Conway 71 y.o. female is seen in consultation today at the request of Dr. Theador Hawthorne for further work-up and management of monoclonal gammopathy.  Serum immunofixation on 07/25/2021 showed IgM lambda monoclonal protein.  SPEP showed faint suspicious band.  Free light chain ratio was normal although elevated kappa light chains and lambda light chains.  Creatinine was 1.15 on 12/07/2021 with a calcium of 9.8.  Hemoglobin was 11.8.  She has stage III CKD since 2022 secondary to diabetes.  She lives with her son and daughter-in-law.  Mostly sits in a chair or in the bed.  Movement is limited by dizziness and right leg problem.  Walks with help of walker.  She worked as a Youth worker prior to retirement.  Non-smoker.  No family history of multiple myeloma.  2 sisters had cervical cancer and 1 sister had lung cancer.   MEDICAL HISTORY:  Past Medical History:  Diagnosis Date   Abnormal EKG    Anxiety    Arthritis    Chronic kidney disease    stage 3   Complication of anesthesia    difficulty waking up   Diabetes mellitus without complication ([place])    Gout    H/O degenerative disc disease    Hyperlipidemia    Hypertension     SURGICAL HISTORY: Past Surgical History:  Procedure Laterality Date   APPENDECTOMY  1985   cholecystectomy  1985   COLONOSCOPY     COLONOSCOPY WITH PROPOFOL N/A 10/29/2021   Procedure: COLONOSCOPY WITH PROPOFOL;  Surgeon: Eloise Harman, DO;  [facility];  Service: Endoscopy;  Laterality: N/A;  11:00AM, ASA 3   POLYPECTOMY  10/29/2021   Procedure: POLYPECTOMY;  Surgeon: Eloise Harman, DO;  Location: [facility];  Service: Endoscopy;;   TONSILLECTOMY  1957    SOCIAL HISTORY: Social History   Socioeconomic History   Marital status: Widowed    Spouse name: Not on file   Number of children: Not on file   Years of education: Not on file   Highest education level: Not on file  Occupational History   Not on file  Tobacco Use   Smoking status: Never   Smokeless tobacco: Never  Vaping Use   Vaping Use: Never used  Substance and Sexual Activity   Alcohol use: No   Drug use: Not Currently   Sexual activity: Not Currently  Other Topics Concern   Not on file  Social History Narrative   Not on file   Social Determinants of Health   Financial Resource Strain: Not on file  Food Insecurity: Not on file  Transportation Needs: Not on file  Physical Activity: Not on file  Stress: Not on file  Social Connections: Not on file  Intimate Partner Violence: Not on file    FAMILY HISTORY: Family History  Problem Relation Age of Onset   Lung cancer Mother    Heart attack Father    Heart disease Father    Diabetes Sister     ALLERGIES:  is allergic to amlodipine, codeine, and influenza vaccines.  MEDICATIONS:  Current Outpatient Medications  Medication Sig Dispense Refill   acetaminophen (TYLENOL) 325 MG tablet Take 650 mg by  mouth every 6 (six) hours as needed for moderate pain.     allopurinol (ZYLOPRIM) 100 MG tablet Take 100 mg by mouth daily.     atorvastatin (LIPITOR) 80 MG tablet Take 80 mg by mouth daily.     carvedilol (COREG) 12.5 MG tablet Take 12.5 mg by mouth 2 (two) times daily.     cholecalciferol (VITAMIN D3) 25 MCG (1000 UNIT) tablet Take 1,000 Units by mouth daily.     cyanocobalamin (,VITAMIN B-12,) 1000 MCG/ML injection Inject 1,000 mcg into the muscle every Friday.     empagliflozin (JARDIANCE) 10 MG TABS tablet Take 10 mg by mouth daily.     escitalopram (LEXAPRO) 5 MG tablet Take 5 mg by mouth daily.     lisinopril (ZESTRIL) 20 MG tablet TAKE 1 TABLET BY MOUTH  DAILY 30 tablet 6   loratadine (CLARITIN) 10 MG tablet Take 10 mg by mouth daily.     magnesium oxide (MAG-OX) 400 (240 Mg) MG tablet Take 400 mg by mouth 2 (two) times daily.     metFORMIN (GLUCOPHAGE) 1000 MG tablet Take 1,000 mg by mouth daily with breakfast.     omeprazole (PRILOSEC) 20 MG capsule Take 20 mg by mouth daily.      polyethylene glycol (MIRALAX / GLYCOLAX) 17 g packet Take 17 g by mouth daily as needed for moderate constipation.     No current facility-administered medications for this visit.    REVIEW OF SYSTEMS:   Constitutional: Denies fevers, chills or abnormal night sweats Eyes: Denies blurriness of vision, double vision or watery eyes Ears, nose, mouth, throat, and face: Denies mucositis or sore throat Respiratory: Positive for cough.  Denies any shortness of breath. Cardiovascular: Denies palpitation, chest discomfort or lower extremity swelling Gastrointestinal:  Denies nausea, heartburn or change in bowel habits.  Positive for constipation. Skin: Denies abnormal skin rashes Lymphatics: Denies new lymphadenopathy or easy bruising Neurological:Denies numbness, tingling or new weaknesses Behavioral/Psych: Mood is stable, no new changes  All other systems were reviewed with the patient and are negative.  PHYSICAL EXAMINATION: ECOG PERFORMANCE STATUS: 2 - Symptomatic, <50% confined to bed  Vitals:   01/11/22 0903  BP: 113/61  Pulse: 73  Resp: 18  Temp: 97.9 F (36.6 C)  SpO2: 100%   Filed Weights   01/11/22 0903  Weight: 142 lb (64.4 kg)    GENERAL:alert, no distress and comfortable SKIN: skin color, texture, turgor are normal, no rashes or significant lesions EYES: normal, conjunctiva are pink and non-injected, sclera clear OROPHARYNX:no exudate, no erythema and lips, buccal mucosa, and tongue normal  NECK: supple, thyroid normal size, non-tender, without nodularity LYMPH:  no palpable lymphadenopathy in the cervical, axillary or inguinal LUNGS:  clear to auscultation and percussion with normal breathing effort HEART: regular rate & rhythm and no murmurs and no lower extremity edema ABDOMEN:abdomen soft, non-tender and normal bowel sounds Musculoskeletal:no cyanosis of digits and no clubbing  PSYCH: alert & oriented x 3 with fluent speech NEURO: no focal motor/sensory deficits  LABORATORY DATA:  I have reviewed the data as listed Recent Results (from the past 2160 hour(s))  Basic metabolic panel     Status: Abnormal   Collection Time: 10/25/21  2:57 PM  Result Value Ref Range   Sodium 138 135 - 145 mmol/L   Potassium 5.0 3.5 - 5.1 mmol/L   Chloride 107 98 - 111 mmol/L   CO2 23 22 - 32 mmol/L   Glucose, Bld 77 70 - 99 mg/dL  Comment: Glucose reference range applies only to samples taken after fasting for at least 8 hours.   BUN 22 8 - 23 mg/dL   Creatinine, Ser 1.09 (H) 0.44 - 1.00 mg/dL   Calcium 9.6 8.9 - 10.3 mg/dL   GFR, Estimated 54 (L) >60 mL/min    Comment: (NOTE) Calculated using the CKD-EPI Creatinine Equation (2021)    Anion gap 8 5 - 15    Comment: Performed at [facility], [address], [place], [place] [address]  Glucose, capillary     Status: None   Collection Time: 10/29/21 10:16 AM  Result Value Ref Range   Glucose-Capillary 91 70 - 99 mg/dL    Comment: Glucose reference range applies only to samples taken after fasting for at least 8 hours.  Surgical pathology     Status: None   Collection Time: 10/29/21 10:44 AM  Result Value Ref Range   SURGICAL PATHOLOGY      SURGICAL PATHOLOGY CASE: [id number] PATIENT: Angelica Conway Surgical Pathology Report     Clinical History: Hx colon polyps, diarrhea, bloating, RLQ pain, constipation, hemorrhoids (crm)     FINAL MICROSCOPIC DIAGNOSIS:  A. DESCENDING COLON, POLYPECTOMY: Tubular adenoma Hyperplastic polyp Negative for high-grade dysplasia and carcinoma   GROSS DESCRIPTION:  Received in formalin is a 0.7 cm tan-pink polyp, sectioned and  submitted 1 block.  SW 10/29/2021   Final Diagnosis performed by Tobin Chad, MD.   Electronically signed 10/30/2021 Technical component performed at [facility], [place] [address] [place], [place] [address].  Professional component performed at [facility], [place] [address], [place], [place] [address].  Immunohistochemistry Technical component (if applicable) was performed at [facility] [address], [place], [place], [place] [address].   IMMUNOHISTOCHEMISTRY DISCLAIMER (i f applicable): Some of these immunohistochemical stains may have been developed and the performance characteristics determine by [facility]. Some may not have been cleared or approved by the U.S. Food and Drug Administration. The FDA has determined that such clearance or approval is not necessary. This test is used for clinical purposes. It should not be regarded as investigational or for research. This laboratory is certified under the [place] (CLIA-88) as qualified to perform high complexity clinical laboratory testing.  The controls stained appropriately.   ECHOCARDIOGRAM COMPLETE     Status: None   Collection Time: 12/31/21  1:53 PM  Result Value Ref Range   S' Lateral 2.48 cm   Area-P 1/2 3.42 cm2   Single Plane A2C EF 69.1 %   Single Plane A4C EF 64.9 %   Calc EF 68.1 %   MV M vel 4.77 m/s   MV Peak grad 90.8 mmHg   AR max vel 1.29 cm2   P 1/2 time 917 msec   Ao pk vel 1.15 m/s   AV Peak grad 5.2 mmHg   Radius 0.35 cm   MV Vena cont 0.17 cm   AV Vena cont 0.37 cm  Lactate dehydrogenase     Status: None   Collection Time: 01/11/22 11:37 AM  Result Value Ref Range   LDH 161 98 - 192 U/L    Comment: Performed at [facility], [address] [place], [place] [address]  Comprehensive metabolic panel     Status: Abnormal   Collection Time: 01/11/22 11:37 AM  Result Value Ref Range    Sodium 140 135 - 145 mmol/L   Potassium 4.2 3.5 - 5.1 mmol/L  Chloride 107 98 - 111 mmol/L   CO2 21 (L) 22 - 32 mmol/L   Glucose, Bld 126 (H) 70 - 99 mg/dL    Comment: Glucose reference range applies only to samples taken after fasting for at least 8 hours.   BUN 33 (H) 8 - 23 mg/dL   Creatinine, Ser 1.22 (H) 0.44 - 1.00 mg/dL   Calcium 9.8 8.9 - 10.3 mg/dL   Total Protein 7.3 6.5 - 8.1 g/dL   Albumin 3.7 3.5 - 5.0 g/dL   AST 33 15 - 41 U/L   ALT 28 0 - 44 U/L   Alkaline Phosphatase 68 38 - 126 U/L   Total Bilirubin 0.4 0.3 - 1.2 mg/dL   GFR, Estimated 47 (L) >60 mL/min    Comment: (NOTE) Calculated using the CKD-EPI Creatinine Equation (2021)    Anion gap 12 5 - 15    Comment: Performed at [facility], [address] [place], [place] [address]  CBC with Differential     Status: None   Collection Time: 01/11/22 11:37 AM  Result Value Ref Range   WBC 8.7 4.0 - 10.5 K/uL   RBC 4.00 3.87 - 5.11 MIL/uL   Hemoglobin 12.0 12.0 - 15.0 g/dL   HCT 38.4 36.0 - 46.0 %   MCV 96.0 80.0 - 100.0 fL   MCH 30.0 26.0 - 34.0 pg   MCHC 31.3 30.0 - 36.0 g/dL   RDW 13.6 11.5 - 15.5 %   Platelets 210 150 - 400 K/uL   nRBC 0.0 0.0 - 0.2 %   Neutrophils Relative % 68 %   Neutro Abs 6.0 1.7 - 7.7 K/uL   Lymphocytes Relative 24 %   Lymphs Abs 2.1 0.7 - 4.0 K/uL   Monocytes Relative 6 %   Monocytes Absolute 0.5 0.1 - 1.0 K/uL   Eosinophils Relative 1 %   Eosinophils Absolute 0.1 0.0 - 0.5 K/uL   Basophils Relative 1 %   Basophils Absolute 0.1 0.0 - 0.1 K/uL   Immature Granulocytes 0 %   Abs Immature Granulocytes 0.03 0.00 - 0.07 K/uL    Comment: Performed at [facility], [address], [place], [place] [address]    RADIOGRAPHIC STUDIES: I have personally reviewed the radiological images as listed and agreed with the findings in the report. ECHOCARDIOGRAM COMPLETE  Result Date: 12/31/2021    ECHOCARDIOGRAM REPORT   Patient Name:   GENELDA ROARK Date of Exam: 12/31/2021 Medical Rec #:   [id number]    Height:       62.5 in Accession #:    [id number]   Weight:       141.0 lb Date of Birth:  Sep 29, 1950    BSA:          1.658 m Patient Age:    67 years     BP:           148/75 mmHg Patient Gender: F            HR:           62 bpm. Exam Location:  [facility] Procedure: 2D Echo, Cardiac Doppler, Color Doppler and Strain Analysis Indications:    R55 (ICD-10-CM) - Syncope and collapse  History:        Patient has no prior history of Echocardiogram examinations.                 Abnormal ECG, CKD, Signs/Symptoms:Syncope; Risk  Factors:Hypertension, Diabetes, Dyslipidemia and Non-Smoker.  Sonographer:    Jeneen Montgomery RDMS, RVT, RDCS Referring Phys: [phone] [place]  1. Left ventricular ejection fraction, by estimation, is 60 to 65%. The left ventricle has normal function. The left ventricle has no regional wall motion abnormalities. Left ventricular diastolic parameters are consistent with Grade I diastolic dysfunction (impaired relaxation). The average left ventricular global longitudinal strain is -19.6 %. The global longitudinal strain is normal.  2. Right ventricular systolic function is normal. The right ventricular size is normal. There is normal pulmonary artery systolic pressure. The estimated right ventricular systolic pressure is 97.0 mmHg.  3. The mitral valve is grossly normal. Mild to moderate mitral valve regurgitation.  4. The aortic valve is tricuspid. Aortic valve regurgitation is mild. Aortic valve sclerosis/calcification is present, without any evidence of aortic stenosis. Aortic regurgitation PHT measures 917 msec.  5. The inferior vena cava is normal in size with greater than 50% respiratory variability, suggesting right atrial pressure of 3 mmHg. Comparison(s): No prior Echocardiogram. Nuclear stress test done 04/11/16 showed an EF of 65%. FINDINGS  Left Ventricle: Left ventricular ejection fraction, by estimation, is 60 to 65%. The left ventricle has  normal function. The left ventricle has no regional wall motion abnormalities. The average left ventricular global longitudinal strain is -19.6 %. The global longitudinal strain is normal. The left ventricular internal cavity size was normal in size. There is borderline left ventricular hypertrophy. Left ventricular diastolic parameters are consistent with Grade I diastolic dysfunction (impaired relaxation). Right Ventricle: The right ventricular size is normal. No increase in right ventricular wall thickness. Right ventricular systolic function is normal. There is normal pulmonary artery systolic pressure. The tricuspid regurgitant velocity is 1.95 m/s, and  with an assumed right atrial pressure of 3 mmHg, the estimated right ventricular systolic pressure is 26.3 mmHg. Left Atrium: Left atrial size was normal in size. Right Atrium: Right atrial size was normal in size. Pericardium: Trivial pericardial effusion is present. The pericardial effusion is anterior to the right ventricle. Presence of epicardial fat layer. Mitral Valve: The mitral valve is grossly normal. There is mild calcification of the mitral valve leaflet(s). Mild to moderate mitral valve regurgitation. Tricuspid Valve: The tricuspid valve is grossly normal. Tricuspid valve regurgitation is trivial. Aortic Valve: The aortic valve is tricuspid. There is mild to moderate aortic valve annular calcification. Aortic valve regurgitation is mild. Aortic regurgitation PHT measures 917 msec. Aortic valve sclerosis/calcification is present, without any evidence of aortic stenosis. Aortic valve peak gradient measures 5.2 mmHg. Pulmonic Valve: The pulmonic valve was grossly normal. Pulmonic valve regurgitation is trivial. Aorta: The aortic root is normal in size and structure. Venous: The inferior vena cava is normal in size with greater than 50% respiratory variability, suggesting right atrial pressure of 3 mmHg. IAS/Shunts: No atrial level shunt detected by  color flow Doppler.  LEFT VENTRICLE PLAX 2D LVIDd:         3.82 cm     Diastology LVIDs:         2.48 cm     LV e' medial:    6.53 cm/s LV PW:         0.69 cm     LV E/e' medial:  11.1 LV IVS:        1.07 cm     LV e' lateral:   5.87 cm/s LVOT diam:     1.70 cm     LV E/e' lateral: 12.3 LV SV:  42 LV SV Index:   25          2D Longitudinal Strain LVOT Area:     2.27 cm    2D Strain GLS (A2C):   -18.0 %                            2D Strain GLS (A3C):   -21.7 %                            2D Strain GLS (A4C):   -19.1 % LV Volumes (MOD)           2D Strain GLS Avg:     -19.6 % LV vol d, MOD A2C: 69.9 ml LV vol d, MOD A4C: 69.9 ml LV vol s, MOD A2C: 21.6 ml LV vol s, MOD A4C: 24.5 ml LV SV MOD A2C:     48.3 ml LV SV MOD A4C:     69.9 ml LV SV MOD BP:      48.9 ml RIGHT VENTRICLE RV S prime:     7.62 cm/s TAPSE (M-mode): 1.7 cm LEFT ATRIUM             Index        RIGHT ATRIUM          Index LA diam:        3.20 cm 1.93 cm/m   RA Area:     8.98 cm LA Vol (A2C):   31.9 ml 19.24 ml/m  RA Volume:   17.80 ml 10.74 ml/m LA Vol (A4C):   43.8 ml 26.42 ml/m LA Biplane Vol: 39.4 ml 23.77 ml/m  AORTIC VALVE AV Area (Vmax):    1.29 cm AV Vmax:           114.50 cm/s AV Peak Grad:      5.2 mmHg LVOT Vmax:         65.30 cm/s LVOT Vmean:        49.400 cm/s LVOT VTI:          0.184 m AI PHT:            917 msec AR Vena Contracta: 0.37 cm  AORTA Ao Root diam: 3.00 cm MITRAL VALVE                    TRICUSPID VALVE MV Area (PHT): 3.42 cm         TR Peak grad:   15.2 mmHg MV Decel Time: 222 msec         TR Vmax:        195.00 cm/s MR Peak grad:      90.8 mmHg MR Mean grad:      74.0 mmHg    SHUNTS MR Vmax:           476.50 cm/s  Systemic VTI:  0.18 m MR Vmean:          414.0 cm/s   Systemic Diam: 1.70 cm MR Vena Contracta: 0.17 cm MR PISA:           0.77 cm MR PISA Eff ROA:   4 mm MR PISA Radius:    0.35 cm MV E velocity: 72.40 cm/s MV A velocity: 81.80 cm/s MV E/A ratio:  0.89 Rozann Lesches MD Electronically signed by  Rozann Lesches MD Signature Date/Time: 12/31/2021/2:54:59 PM    Final     ASSESSMENT:  1.  IgM lambda monoclonal gammopathy: - Patient seen at the request of Dr. Theador Hawthorne - Immunofixation (07/25/2021): Faint IgM lambda monoclonal protein detected - Free kappa light chains 47.5, free lambda light chains 48.1, ratio 0.99 - SPEP (07/25/2021) poorly defined band of restricted protein mobility in the gammaglobulin - She had personal history of cervical cancer in her 84s, treated with D&C and conization. - She has numbness in the bottom of the feet, likely from diabetes of 20 years duration.  No prior history of thrombosis.  No fall in the last 6 months.  2.  Social/family history: - Lives at home with son and daughter-in-law.  Mostly sits in chair in the bed.  Movement is limited by dizziness and right leg problem.  Walks with help of walker.  Worked as a Youth worker prior to retirement.  Non-smoker. - No family history of multiple myeloma.  2 sisters had cervical cancer and 1 sister had lung cancer.  3.  Stage III CKD: - CKD since 2022, thought to be secondary to diabetes mellitus. - Nonnephrotic range proteinuria, 24-hour protein 1.3 g - Renal ultrasound (08/03/2021): Increased renal parenchymal echogenicity typical of chronic medical renal disease.   PLAN:  1.  IgM lambda monoclonal gammopathy: - We talked about the spectrum of monoclonal gammopathy ranging from MGUS to multiple myeloma. - Recommend checking his CBC and CMP today.  We will also check SPEP, immunofixation and free light chains, LDH and beta-2 microglobulin. - Would order skeletal survey to evaluate for lytic lesions. - If there is significant M spike of more than 1 g we will also consider bone marrow aspiration and biopsy and 24-hour urine for total protein, UPEP, immunofixation. - If the M spike is not detectable or minimally elevated, will do follow-ups every 6 months with labs.   All questions were answered. The  patient knows to call the clinic with any problems, questions or concerns.      Derek Jack, MD 01/11/22 4:44 PM

## 2022-01-12 LAB — BETA 2 MICROGLOBULIN, SERUM: Beta-2 Microglobulin: 4.5 mg/L — ABNORMAL HIGH (ref 0.6–2.4)

## 2022-01-14 LAB — KAPPA/LAMBDA LIGHT CHAINS
Kappa free light chain: 89.3 mg/L — ABNORMAL HIGH (ref 3.3–19.4)
Kappa, lambda light chain ratio: 1.62 (ref 0.26–1.65)
Lambda free light chains: 55.2 mg/L — ABNORMAL HIGH (ref 5.7–26.3)

## 2022-01-15 LAB — PROTEIN ELECTROPHORESIS, SERUM
A/G Ratio: 1.2 (ref 0.7–1.7)
Albumin ELP: 3.5 g/dL (ref 2.9–4.4)
Alpha-1-Globulin: 0.3 g/dL (ref 0.0–0.4)
Alpha-2-Globulin: 0.9 g/dL (ref 0.4–1.0)
Beta Globulin: 1 g/dL (ref 0.7–1.3)
Gamma Globulin: 0.9 g/dL (ref 0.4–1.8)
Globulin, Total: 3 g/dL (ref 2.2–3.9)
Total Protein ELP: 6.5 g/dL (ref 6.0–8.5)

## 2022-01-16 LAB — IMMUNOFIXATION ELECTROPHORESIS
IgA: 265 mg/dL (ref 64–422)
IgG (Immunoglobin G), Serum: 773 mg/dL (ref 586–1602)
IgM (Immunoglobulin M), Srm: 301 mg/dL — ABNORMAL HIGH (ref 26–217)
Total Protein ELP: 6.4 g/dL (ref 6.0–8.5)

## 2022-01-21 ENCOUNTER — Telehealth (HOSPITAL_BASED_OUTPATIENT_CLINIC_OR_DEPARTMENT_OTHER): Payer: Self-pay

## 2022-01-21 NOTE — Telephone Encounter (Addendum)
Results called to patient who verbalizes understanding!      ----- Message from Loel Dubonnet, NP sent at 01/21/2022  9:52 AM EST ----- Monitor with predominantly normal sinus rhythm.  8 runs of SVT (supraventricular tachycardia) which is a fast heart rate in the top chamber of heart.  This is were infrequent and lasting no more than 15 seconds-not of concern.   No significant arrhythmias that would cause syncope.  Result!

## 2022-01-30 NOTE — Progress Notes (Unsigned)
Angelica Conway, [place] [address]   CLINIC:  Medical Oncology/Hematology  PCP:  Rosine Door [place] [id number] [phone]   REASON FOR VISIT:  Follow-up for abnormal SPEP  PRIOR THERAPY: None  CURRENT THERAPY: Surveillance  INTERVAL HISTORY:  [name] 71 y.o. female returns for routine follow-up of her abnormal SPEP.  She was seen for initial visit by Dr. Delton Coombes on 01/11/2022.  At today's visit, she reports feeling fair.  She denies any changes in her symptoms or health status since her visit with Dr. Delton Coombes 2 weeks ago.  She denies any new bone pain or recent fractures, but does have some shoulder pain if she "sleeps on it wrong."  She denies any B symptoms such as fever, chills, night sweats, unintentional weight loss.   She has chronic dizziness that limits her mobility.  She has some chronic neuropathy on the bottom of her feet, suspected to be secondary to diabetes.  No history of thromboevents.  No new masses or lymphadenopathy per her report.  She has had some intermittent syncopal episodes when having bowel movements and is following with her PCP for presumed vasovagal syncope.  She has 50% energy and 100% appetite. She endorses that she is maintaining a stable weight.   REVIEW OF SYSTEMS:  Review of Systems  Constitutional:  Positive for fatigue. Negative for appetite change, chills, diaphoresis, fever and unexpected weight change.  HENT:   Negative for lump/mass and nosebleeds.   Eyes:  Negative for eye problems.  Respiratory:  Positive for cough (mornings). Negative for hemoptysis and shortness of breath.   Cardiovascular:  Positive for palpitations. Negative for chest pain and leg swelling.  Gastrointestinal:  Positive for constipation. Negative for abdominal pain, blood in stool, diarrhea, nausea and vomiting.  Genitourinary:  Negative for hematuria.   Musculoskeletal:  Positive for myalgias  (muscle spasms).  Skin: Negative.   Neurological:  Positive for dizziness, headaches and numbness. Negative for light-headedness.  Hematological:  Does not bruise/bleed easily.  Psychiatric/Behavioral:  Positive for sleep disturbance.       PAST MEDICAL/SURGICAL HISTORY:  Past Medical History:  Diagnosis Date   Abnormal EKG    Anxiety    Arthritis    Chronic kidney disease    stage 3   Complication of anesthesia    difficulty waking up   Diabetes mellitus without complication ([place])    Gout    H/O degenerative disc disease    Hyperlipidemia    Hypertension    Past Surgical History:  Procedure Laterality Date   APPENDECTOMY  1985   cholecystectomy  1985   COLONOSCOPY     COLONOSCOPY WITH PROPOFOL N/A 10/29/2021   Procedure: COLONOSCOPY WITH PROPOFOL;  Surgeon: Eloise Harman, DO;  [facility];  Service: Endoscopy;  Laterality: N/A;  11:00AM, ASA 3   POLYPECTOMY  10/29/2021   Procedure: POLYPECTOMY;  Surgeon: Eloise Harman, DO;  Location: [facility];  Service: Endoscopy;;   TONSILLECTOMY  1957     SOCIAL HISTORY:  Social History   Socioeconomic History   Marital status: Widowed    Spouse name: Not on file   Number of children: Not on file   Years of education: Not on file   Highest education level: Not on file  Occupational History   Not on file  Tobacco Use   Smoking status: Never   Smokeless tobacco: Never  Vaping Use   Vaping  Use: Never used  Substance and Sexual Activity   Alcohol use: No   Drug use: Not Currently   Sexual activity: Not Currently  Other Topics Concern   Not on file  Social History Narrative   Not on file   Social Determinants of Health   Financial Resource Strain: Not on file  Food Insecurity: Not on file  Transportation Needs: Not on file  Physical Activity: Not on file  Stress: Not on file  Social Connections: Not on file  Intimate Partner Violence: Not on file    FAMILY HISTORY:  Family History   Problem Relation Age of Onset   Lung cancer Mother    Heart attack Father    Heart disease Father    Diabetes Sister     CURRENT MEDICATIONS:  Outpatient Encounter Medications as of 01/31/2022  Medication Sig   acetaminophen (TYLENOL) 325 MG tablet Take 650 mg by mouth every 6 (six) hours as needed for moderate pain.   allopurinol (ZYLOPRIM) 100 MG tablet Take 100 mg by mouth daily.   atorvastatin (LIPITOR) 80 MG tablet Take 80 mg by mouth daily.   carvedilol (COREG) 12.5 MG tablet Take 12.5 mg by mouth 2 (two) times daily.   cholecalciferol (VITAMIN D3) 25 MCG (1000 UNIT) tablet Take 1,000 Units by mouth daily.   cyanocobalamin (,VITAMIN B-12,) 1000 MCG/ML injection Inject 1,000 mcg into the muscle every Friday.   empagliflozin (JARDIANCE) 10 MG TABS tablet Take 10 mg by mouth daily.   escitalopram (LEXAPRO) 5 MG tablet Take 5 mg by mouth daily.   lisinopril (ZESTRIL) 20 MG tablet TAKE 1 TABLET BY MOUTH DAILY   loratadine (CLARITIN) 10 MG tablet Take 10 mg by mouth daily.   magnesium oxide (MAG-OX) 400 (240 Mg) MG tablet Take 400 mg by mouth 2 (two) times daily.   metFORMIN (GLUCOPHAGE) 1000 MG tablet Take 1,000 mg by mouth daily with breakfast.   omeprazole (PRILOSEC) 20 MG capsule Take 20 mg by mouth daily.    polyethylene glycol (MIRALAX / GLYCOLAX) 17 g packet Take 17 g by mouth daily as needed for moderate constipation.   No facility-administered encounter medications on file as of 01/31/2022.    ALLERGIES:  Allergies  Allergen Reactions   Amlodipine     Unknown reaction   Codeine     Unknown reaction   Influenza Vaccines Swelling    Local reaction at site of injection  Can tolerate egg free version     PHYSICAL EXAM:  ECOG PERFORMANCE STATUS: 3 - Symptomatic, >50% confined to bed  There were no vitals filed for this visit. There were no vitals filed for this visit. Physical Exam Constitutional:      Appearance: Normal appearance.     Comments: Presents in  wheelchair  HENT:     Head: Normocephalic and atraumatic.     Mouth/Throat:     Mouth: Mucous membranes are moist.  Eyes:     Extraocular Movements: Extraocular movements intact.     Pupils: Pupils are equal, round, and reactive to light.  Cardiovascular:     Rate and Rhythm: Normal rate and regular rhythm.     Pulses: Normal pulses.     Heart sounds: Normal heart sounds.  Pulmonary:     Effort: Pulmonary effort is normal.     Breath sounds: Normal breath sounds.  Abdominal:     General: Bowel sounds are normal.     Palpations: Abdomen is soft.     Tenderness: There is no  abdominal tenderness.  Musculoskeletal:        General: No swelling.     Right lower leg: No edema.     Left lower leg: No edema.  Lymphadenopathy:     Cervical: No cervical adenopathy.  Skin:    General: Skin is warm and dry.  Neurological:     General: No focal deficit present.     Mental Status: She is alert and oriented to person, place, and time.  Psychiatric:        Mood and Affect: Mood normal.        Behavior: Behavior normal.      LABORATORY DATA:  I have reviewed the labs as listed.  CBC    Component Value Date/Time   WBC 8.7 01/11/2022 1137   RBC 4.00 01/11/2022 1137   HGB 12.0 01/11/2022 1137   HCT 38.4 01/11/2022 1137   PLT 210 01/11/2022 1137   MCV 96.0 01/11/2022 1137   MCH 30.0 01/11/2022 1137   MCHC 31.3 01/11/2022 1137   RDW 13.6 01/11/2022 1137   LYMPHSABS 2.1 01/11/2022 1137   MONOABS 0.5 01/11/2022 1137   EOSABS 0.1 01/11/2022 1137   BASOSABS 0.1 01/11/2022 1137      Latest Ref Rng & Units 01/11/2022   11:37 AM 10/25/2021    2:57 PM  CMP  Glucose 70 - 99 mg/dL 126  77   BUN 8 - 23 mg/dL 33  22   Creatinine 0.44 - 1.00 mg/dL 1.22  1.09   Sodium 135 - 145 mmol/L 140  138   Potassium 3.5 - 5.1 mmol/L 4.2  5.0   Chloride 98 - 111 mmol/L 107  107   CO2 22 - 32 mmol/L 21  23   Calcium 8.9 - 10.3 mg/dL 9.8  9.6   Total Protein 6.5 - 8.1 g/dL 7.3    Total Bilirubin  0.3 - 1.2 mg/dL 0.4    Alkaline Phos 38 - 126 U/L 68    AST 15 - 41 U/L 33    ALT 0 - 44 U/L 28      DIAGNOSTIC IMAGING:  I have independently reviewed the relevant imaging and discussed with the patient.  ASSESSMENT & PLAN: 1.  IgM lambda monoclonal gammopathy: - Patient seen at the request of Dr. Theador Hawthorne, who noted the following lab abnormalities: Immunofixation (07/25/2021): Faint IgM lambda monoclonal protein detected Free kappa light chains 47.5, free lambda light chains 48.1, ratio 0.99 SPEP (07/25/2021) poorly defined band of restricted protein mobility in the gammaglobulin - Hematology work-up (01/11/2022): Immunofixation shows polyclonal increase in 1 or more immunoglobulins SPEP negative for M spike Elevated kappa light chains 89.3, elevated lambda 55.2.  Normal ratio 1.62, in keeping with her underlying CKD. Mildly elevated beta-2 microglobulin 4.5, normal LDH. - CMP (01/11/2022): Baseline creatinine 1.22/GFR 47 in keeping with previously known CKD stage IIIa/b secondary to diabetes.  Normal calcium 9.8. - CBC (01/11/2022) with normal Hgb 12.0. - Skeletal survey (01/11/2022): No suspicious lytic or sclerotic lesions noted. - She denies any new bone pain or neurologic changes.  No B symptoms.   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 - At this time, patient does not have any evidence of plasma cell dyscrasia, but we did discuss the spectrum of MGUS to multiple myeloma, and the need for continued monitoring. - If she develops any significant M spike >1.0 g in the future, we will also consider bone marrow aspiration and biopsy and  24-hour urine for total protein, UPEP, immunofixation. - PLAN: Labs in 6 months.  Office visit 1 week after labs.  2.  Stage III CKD: - CKD since 2022, thought to be secondary to diabetes mellitus. - Nonnephrotic range proteinuria, 24-hour protein 1.3 g - Renal ultrasound (08/03/2021): Increased renal  parenchymal echogenicity typical of chronic medical renal disease.  3.  Social/family history: - Personal history of cervical cancer in her 41s, treated with D&C and conization - OTHER PMH: Anxiety/depression, type 2 diabetes mellitus, hypertension, hyperlipidemia, CKD, arthritis - Lives at home with son and daughter-in-law.  Mostly sits in chair in the bed.  Movement is limited by dizziness and right leg problem.  Walks with help of walker.  Worked as a Youth worker prior to retirement.  Non-smoker. - No family history of multiple myeloma.  2 sisters had cervical cancer and 1 sister had lung cancer.   PLAN SUMMARY: >> Labs in 6 months (CBC/D, CMP, LDH, SPEP, immunofixation, light chains) >> Office visit in 6 months, 1 week after labs   All questions were answered. The patient knows to call the clinic with any problems, questions or concerns.  Medical decision making: Moderate  Time spent on visit: I spent 20 minutes counseling the patient face to face. The total time spent in the appointment was 30 minutes and more than 50% was on counseling.   Harriett Rush, PA-C  01/31/22 9:31 AM

## 2022-01-31 ENCOUNTER — Inpatient Hospital Stay: Payer: Medicare Other | Attending: Hematology | Admitting: Physician Assistant

## 2022-01-31 ENCOUNTER — Other Ambulatory Visit: Payer: Self-pay

## 2022-01-31 VITALS — BP 140/71 | HR 65 | Temp 97.7°F | Resp 16

## 2022-01-31 DIAGNOSIS — Z801 Family history of malignant neoplasm of trachea, bronchus and lung: Secondary | ICD-10-CM | POA: Insufficient documentation

## 2022-01-31 DIAGNOSIS — E1122 Type 2 diabetes mellitus with diabetic chronic kidney disease: Secondary | ICD-10-CM | POA: Insufficient documentation

## 2022-01-31 DIAGNOSIS — Z8049 Family history of malignant neoplasm of other genital organs: Secondary | ICD-10-CM | POA: Diagnosis not present

## 2022-01-31 DIAGNOSIS — E1140 Type 2 diabetes mellitus with diabetic neuropathy, unspecified: Secondary | ICD-10-CM | POA: Insufficient documentation

## 2022-01-31 DIAGNOSIS — N1832 Chronic kidney disease, stage 3b: Secondary | ICD-10-CM | POA: Insufficient documentation

## 2022-01-31 DIAGNOSIS — D500 Iron deficiency anemia secondary to blood loss (chronic): Secondary | ICD-10-CM

## 2022-01-31 DIAGNOSIS — I129 Hypertensive chronic kidney disease with stage 1 through stage 4 chronic kidney disease, or unspecified chronic kidney disease: Secondary | ICD-10-CM | POA: Diagnosis not present

## 2022-01-31 DIAGNOSIS — C9000 Multiple myeloma not having achieved remission: Secondary | ICD-10-CM

## 2022-01-31 DIAGNOSIS — Z8541 Personal history of malignant neoplasm of cervix uteri: Secondary | ICD-10-CM | POA: Diagnosis not present

## 2022-01-31 DIAGNOSIS — D472 Monoclonal gammopathy: Secondary | ICD-10-CM | POA: Diagnosis not present

## 2022-01-31 DIAGNOSIS — D508 Other iron deficiency anemias: Secondary | ICD-10-CM

## 2022-01-31 NOTE — Patient Instructions (Addendum)
[name] **   You were seen today by Tarri Abernethy PA-C for your follow-up visit.    MGUS: As we discussed, you were sent to [place] by your kidney doctor for evaluation of possible abnormal protein ("MGUS," or "monoclonal gammopathy of undetermined significance"). We did not see any sign of abnormal protein in your blood work. Please see the attached handout for additional information regarding MGUS. We will check these labs again in 6 months to make sure you have not had any major changes.  Continue to follow-up with your primary care doctor and other specialists regarding your other ongoing symptoms.   FOLLOW-UP APPOINTMENT: Office visit in 6 months, 1 week after labs  ** Thank you for trusting me with your healthcare!  I strive to provide all of my patients with quality care at each visit.  If you receive a survey for this visit, I would be so grateful to you for taking the time to provide feedback.  Thank you in advance!  ~ [name]                   Dr. Derek Jack   &   Tarri Abernethy, PA-C   - - - - - - - - - - - - - - - - - -    Thank you for choosing [facility] to provide your oncology and hematology care.  To afford each patient quality time with our provider, please arrive at least 15 minutes before your scheduled appointment time.   If you have a lab appointment with the [facility] please come in thru the Main Entrance and check in at the main information desk.  You need to re-schedule your appointment should you arrive 10 or more minutes late.  We strive to give you quality time with our providers, and arriving late affects you and other patients whose appointments are after yours.  Also, if you no show three or more times for appointments you may be dismissed from the clinic at the providers discretion.     Again, thank you for choosing [facility].  Our hope is that these requests will decrease the amount of time that you wait before being seen by our physicians.       _____________________________________________________________  Should you have questions after your visit to [facility], please contact our office at [phone] and follow the prompts.  Our office hours are 8:00 a.m. and 4:30 p.m. Monday - Friday.  Please note that voicemails left after 4:00 p.m. may not be returned until the following business day.  We are closed weekends and major holidays.  You do have access to a nurse 24-7, just call the main number to the clinic [phone] and do not press any options, hold on the line and a nurse will answer the phone.    For prescription refill requests, have your pharmacy contact our office and allow 72 hours.

## 2022-02-13 ENCOUNTER — Encounter: Payer: Self-pay | Admitting: Cardiology

## 2022-02-13 ENCOUNTER — Ambulatory Visit: Payer: Medicare Other | Attending: Cardiology | Admitting: Cardiology

## 2022-02-13 VITALS — BP 144/70 | HR 74 | Ht 62.0 in | Wt 139.6 lb

## 2022-02-13 DIAGNOSIS — R55 Syncope and collapse: Secondary | ICD-10-CM | POA: Diagnosis not present

## 2022-02-13 DIAGNOSIS — I10 Essential (primary) hypertension: Secondary | ICD-10-CM

## 2022-02-13 NOTE — Patient Instructions (Signed)

## 2022-02-13 NOTE — Progress Notes (Signed)
Clinical Summary Angelica Conway is a 71 y.o.female seen today for follow up of the following medical problems.    1. Bradycardia -ER visit [facility] 12/26/2020 with low HRs - was on cardizem and metoprolol at the time. Cardizem had recently been increased to '240mg'$  daily due to high bp's - from ER notes HRs in 30s, given atropine and calcium - HR imporved off meds   - no recent issues       2. HTN - dilt and lopressor stopped after ER visit - was to start hydralazine '25mg'$  tid,a s well as lisinopril/HCTZ 40/25 - listed allergy to norvasc not specified. Leg edema per her report.   -02/2021 normal renin/aldo levels   - neprhology started coreg 07/21/21   -home bp's 130s-150s/70s     3. CKD III - followed by Dr Theador Hawthorne   4. 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 Syncope - seen 12/2021 by NP Gilford Rile in clinic - at that time reported 3 episodes of syncope over 1.5 months - thought to be vasovagal syncope - 12/2021 echo: LVEF 60-65%, grade I dd,  -12/2021 heart monitor: 14 day monitor, 8 runs SVT longest 15.6 sec.  - does restrict her fluid intake  - recurrent syncope   - 4-5 episodes over the last 3-4 months - most recent episode 2 weeks ago. Was sitting on commode, felt like she needed to have a BM. Was not straining. Can feeling of pressure shoulders and neck, felt like she was getting ready to pass out. +LOC. Family came in and checked on her, had BM at the time. Was unresponsive for few minutes, came too and groggy  - prior episode in Sept. Occurred on commode, felt need to BM. Was not straining. Constipated, hard time passing BM. Pressure feeling, like getting ready to go out.   -episode when came out of bathroom, got up and open door and had episode of syncope.  - limiting fluid 1200 mL -has seen GI about constipation - she reports normal orthostatics Past Medical History:  Diagnosis Date   Abnormal EKG    Anxiety    Arthritis    Chronic  kidney disease    stage 3   Complication of anesthesia    difficulty waking up   Diabetes mellitus without complication ([facility])    Gout    H/O degenerative disc disease    Hyperlipidemia    Hypertension      Allergies  Allergen Reactions   Amlodipine     Unknown reaction   Codeine     Unknown reaction   Influenza Vaccines Swelling    Local reaction at site of injection  Can tolerate egg free version     Current Outpatient Medications  Medication Sig Dispense Refill   acetaminophen (TYLENOL) 325 MG tablet Take 650 mg by mouth every 6 (six) hours as needed for moderate pain.     allopurinol (ZYLOPRIM) 100 MG tablet Take 100 mg by mouth daily.     atorvastatin (LIPITOR) 80 MG tablet Take 80 mg by mouth daily.     carvedilol (COREG) 12.5 MG tablet Take 12.5 mg by mouth 2 (two) times daily.     cholecalciferol (VITAMIN D3) 25 MCG (1000 UNIT) tablet Take 1,000 Units by mouth daily.     cyanocobalamin (,VITAMIN B-12,) 1000 MCG/ML injection Inject 1,000 mcg into the muscle every Friday.     empagliflozin (JARDIANCE) 10 MG TABS tablet Take 10 mg by mouth daily.  escitalopram (LEXAPRO) 5 MG tablet Take 5 mg by mouth daily.     lisinopril (ZESTRIL) 20 MG tablet TAKE 1 TABLET BY MOUTH DAILY 30 tablet 6   loratadine (CLARITIN) 10 MG tablet Take 10 mg by mouth daily.     magnesium oxide (MAG-OX) 400 (240 Mg) MG tablet Take 400 mg by mouth 2 (two) times daily.     metFORMIN (GLUCOPHAGE) 1000 MG tablet Take 1,000 mg by mouth daily with breakfast.     omeprazole (PRILOSEC) 20 MG capsule Take 20 mg by mouth daily.      polyethylene glycol (MIRALAX / GLYCOLAX) 17 g packet Take 17 g by mouth daily as needed for moderate constipation.     No current facility-administered medications for this visit.     Past Surgical History:  Procedure Laterality Date   APPENDECTOMY  1985   cholecystectomy  1985   COLONOSCOPY     COLONOSCOPY WITH PROPOFOL N/A 10/29/2021   Procedure: COLONOSCOPY WITH  PROPOFOL;  Surgeon: Eloise Harman, DO;  [facility];  Service: Endoscopy;  Laterality: N/A;  11:00AM, ASA 3   POLYPECTOMY  10/29/2021   Procedure: POLYPECTOMY;  Surgeon: Eloise Harman, DO;  [facility];  Service: Endoscopy;;   TONSILLECTOMY  1957     Allergies  Allergen Reactions   Amlodipine     Unknown reaction   Codeine     Unknown reaction   Influenza Vaccines Swelling    Local reaction at site of injection  Can tolerate egg free version      Family History  Problem Relation Age of Onset   Lung cancer Mother    Heart attack Father    Heart disease Father    Diabetes Sister      Social History Ms. Ringler reports that she has never smoked. She has never used smokeless tobacco. Ms. Mcconaughy reports no history of alcohol use.   Review of Systems CONSTITUTIONAL: No weight loss, fever, chills, weakness or fatigue.  HEENT: Eyes: No visual loss, blurred vision, double vision or yellow sclerae.No hearing loss, sneezing, congestion, runny nose or sore throat.  SKIN: No rash or itching.  CARDIOVASCULAR: per hpi RESPIRATORY: No shortness of breath, cough or sputum.  GASTROINTESTINAL: No anorexia, nausea, vomiting or diarrhea. No abdominal pain or blood.  GENITOURINARY: No burning on urination, no polyuria NEUROLOGICAL: No headache, dizziness, syncope, paralysis, ataxia, numbness or tingling in the extremities. No change in bowel or bladder control.  MUSCULOSKELETAL: No muscle, back pain, joint pain or stiffness.  LYMPHATICS: No enlarged nodes. No history of splenectomy.  PSYCHIATRIC: No history of depression or anxiety.  ENDOCRINOLOGIC: No reports of sweating, cold or heat intolerance. No polyuria or polydipsia.  Marland Kitchen   Physical Examination Today's Vitals   02/13/22 1009  BP: (!) 144/70  Pulse: 74  SpO2: 100%  Weight: 139 lb 9.6 oz (63.3 kg)  Height: '5\' 2"'$  (1.575 m)   Body mass index is 25.53 kg/m.  Gen: resting comfortably, no acute  distress HEENT: no scleral icterus, pupils equal round and reactive, no palptable cervical adenopathy,  CV: RRR, no m/r/g, no jvd Resp: Clear to auscultation bilaterally GI: abdomen is soft, non-tender, non-distended, normal bowel sounds, no hepatosplenomegaly MSK: extremities are warm, no edema.  Skin: warm, no rash Neuro:  no focal deficits Psych: appropriate affect   Diagnostic Studies     Assessment and Plan  1.Syncope - history supports vasovagal syncope. Benign cardiac workup with echo and monitor - episodes may  be exacerbated by fluid restriction in setting of SIADH, will contact renal to see if can loosen restriction.  2. HTN - up and down bp, with ongoing syncope would not be more aggressive with bp at this time.       Arnoldo Lenis, M.D.

## 2022-04-01 DIAGNOSIS — I4510 Unspecified right bundle-branch block: Secondary | ICD-10-CM | POA: Diagnosis not present

## 2022-04-01 DIAGNOSIS — M109 Gout, unspecified: Secondary | ICD-10-CM | POA: Diagnosis not present

## 2022-04-01 DIAGNOSIS — R300 Dysuria: Secondary | ICD-10-CM | POA: Diagnosis not present

## 2022-04-01 DIAGNOSIS — D472 Monoclonal gammopathy: Secondary | ICD-10-CM | POA: Diagnosis not present

## 2022-04-01 DIAGNOSIS — E7849 Other hyperlipidemia: Secondary | ICD-10-CM | POA: Diagnosis not present

## 2022-04-01 DIAGNOSIS — R55 Syncope and collapse: Secondary | ICD-10-CM | POA: Diagnosis not present

## 2022-04-01 DIAGNOSIS — I10 Essential (primary) hypertension: Secondary | ICD-10-CM | POA: Diagnosis not present

## 2022-04-01 DIAGNOSIS — E1140 Type 2 diabetes mellitus with diabetic neuropathy, unspecified: Secondary | ICD-10-CM | POA: Diagnosis not present

## 2022-04-01 DIAGNOSIS — N183 Chronic kidney disease, stage 3 unspecified: Secondary | ICD-10-CM | POA: Diagnosis not present

## 2022-04-01 DIAGNOSIS — E538 Deficiency of other specified B group vitamins: Secondary | ICD-10-CM | POA: Diagnosis not present

## 2022-04-01 DIAGNOSIS — E46 Unspecified protein-calorie malnutrition: Secondary | ICD-10-CM | POA: Diagnosis not present

## 2022-04-08 ENCOUNTER — Encounter: Payer: Self-pay | Admitting: Neurology

## 2022-04-08 ENCOUNTER — Ambulatory Visit: Payer: Medicare Other | Admitting: Neurology

## 2022-04-16 DIAGNOSIS — R809 Proteinuria, unspecified: Secondary | ICD-10-CM | POA: Diagnosis not present

## 2022-04-16 DIAGNOSIS — N189 Chronic kidney disease, unspecified: Secondary | ICD-10-CM | POA: Diagnosis not present

## 2022-04-16 DIAGNOSIS — E1129 Type 2 diabetes mellitus with other diabetic kidney complication: Secondary | ICD-10-CM | POA: Diagnosis not present

## 2022-04-16 DIAGNOSIS — E1122 Type 2 diabetes mellitus with diabetic chronic kidney disease: Secondary | ICD-10-CM | POA: Diagnosis not present

## 2022-04-16 DIAGNOSIS — R338 Other retention of urine: Secondary | ICD-10-CM | POA: Diagnosis not present

## 2022-04-18 ENCOUNTER — Other Ambulatory Visit: Payer: Self-pay | Admitting: Cardiology

## 2022-04-22 ENCOUNTER — Ambulatory Visit: Payer: Medicare Other | Admitting: Urology

## 2022-04-24 DIAGNOSIS — E1129 Type 2 diabetes mellitus with other diabetic kidney complication: Secondary | ICD-10-CM | POA: Diagnosis not present

## 2022-04-24 DIAGNOSIS — R809 Proteinuria, unspecified: Secondary | ICD-10-CM | POA: Diagnosis not present

## 2022-04-24 DIAGNOSIS — D472 Monoclonal gammopathy: Secondary | ICD-10-CM | POA: Diagnosis not present

## 2022-04-24 DIAGNOSIS — R808 Other proteinuria: Secondary | ICD-10-CM | POA: Diagnosis not present

## 2022-04-24 DIAGNOSIS — E1122 Type 2 diabetes mellitus with diabetic chronic kidney disease: Secondary | ICD-10-CM | POA: Diagnosis not present

## 2022-04-24 DIAGNOSIS — N189 Chronic kidney disease, unspecified: Secondary | ICD-10-CM | POA: Diagnosis not present

## 2022-05-02 DIAGNOSIS — E538 Deficiency of other specified B group vitamins: Secondary | ICD-10-CM | POA: Diagnosis not present

## 2022-05-28 DIAGNOSIS — R809 Proteinuria, unspecified: Secondary | ICD-10-CM | POA: Diagnosis not present

## 2022-05-28 DIAGNOSIS — E1129 Type 2 diabetes mellitus with other diabetic kidney complication: Secondary | ICD-10-CM | POA: Diagnosis not present

## 2022-05-28 DIAGNOSIS — E1122 Type 2 diabetes mellitus with diabetic chronic kidney disease: Secondary | ICD-10-CM | POA: Diagnosis not present

## 2022-05-28 DIAGNOSIS — R808 Other proteinuria: Secondary | ICD-10-CM | POA: Diagnosis not present

## 2022-05-28 DIAGNOSIS — N189 Chronic kidney disease, unspecified: Secondary | ICD-10-CM | POA: Diagnosis not present

## 2022-06-03 ENCOUNTER — Other Ambulatory Visit (HOSPITAL_COMMUNITY): Payer: Self-pay | Admitting: Nephrology

## 2022-06-03 ENCOUNTER — Ambulatory Visit: Payer: Medicare Other | Admitting: Urology

## 2022-06-03 VITALS — BP 148/78 | HR 67 | Ht 62.0 in | Wt 140.0 lb

## 2022-06-03 DIAGNOSIS — E1122 Type 2 diabetes mellitus with diabetic chronic kidney disease: Secondary | ICD-10-CM | POA: Diagnosis not present

## 2022-06-03 DIAGNOSIS — N189 Chronic kidney disease, unspecified: Secondary | ICD-10-CM | POA: Diagnosis not present

## 2022-06-03 DIAGNOSIS — R809 Proteinuria, unspecified: Secondary | ICD-10-CM

## 2022-06-03 DIAGNOSIS — R3129 Other microscopic hematuria: Secondary | ICD-10-CM

## 2022-06-03 DIAGNOSIS — E1129 Type 2 diabetes mellitus with other diabetic kidney complication: Secondary | ICD-10-CM

## 2022-06-03 DIAGNOSIS — N3946 Mixed incontinence: Secondary | ICD-10-CM | POA: Diagnosis not present

## 2022-06-03 DIAGNOSIS — R808 Other proteinuria: Secondary | ICD-10-CM | POA: Diagnosis not present

## 2022-06-03 DIAGNOSIS — N182 Chronic kidney disease, stage 2 (mild): Secondary | ICD-10-CM

## 2022-06-03 DIAGNOSIS — D472 Monoclonal gammopathy: Secondary | ICD-10-CM | POA: Diagnosis not present

## 2022-06-03 LAB — URINALYSIS, ROUTINE W REFLEX MICROSCOPIC
Bilirubin, UA: NEGATIVE
Ketones, UA: NEGATIVE
Nitrite, UA: POSITIVE — AB
Specific Gravity, UA: 1.025 (ref 1.005–1.030)
Urobilinogen, Ur: 0.2 mg/dL (ref 0.2–1.0)
pH, UA: 6 (ref 5.0–7.5)

## 2022-06-03 LAB — MICROSCOPIC EXAMINATION
Epithelial Cells (non renal): >10 /hpf — AB (ref 0–10)
RBC, Urine: >30 /hpf — AB (ref 0–2)
WBC, UA: >30 /hpf — AB (ref 0–5)

## 2022-06-03 LAB — BLADDER SCAN AMB NON-IMAGING: Scan Result: 0

## 2022-06-03 NOTE — Progress Notes (Unsigned)
06/03/2022 11:14 AM   Angelica Conway July 10, 1950 [id number]  Referring provider: Liana Gerold, MD [phone] [place],   [address]  No chief complaint on file.   HPI:  1) Mixed incontinence-she has a history of mixed incontinence and saw Dr. Milford Cage.  She tried tolterodine but had dry mouth.  She tried [place] which worked well but it was expensive.  She was on Myrbetriq.  2) Urinary retention-she has a history of urinary retention episodes.  Last was in March 2023 when she was in hospital for hyponatremia with encephalopathy but she passed a voiding trial.  A May 2023 renal ultrasound by Dr. Theador Hawthorne revealed no hydronephrosis, very small left renal cyst, bladder 51 mL.  Today, seen for the above. She voids with a good stream. She has frequency and urgency. She can make it 2 hours. Standing triggers. She wears a pad. She managers well. No dysuria or gross hematuria. MH on UA today. A Feb 2023 CT was benign at [facility].   Her Mar 2024 cr 1.13 gfr 52.   She walks with a walker at home but going out uses wheelchair.   PMH: Past Medical History:  Diagnosis Date   Abnormal EKG    Anxiety    Arthritis    Chronic kidney disease    stage 3   Complication of anesthesia    difficulty waking up   Diabetes mellitus without complication ([place])    Gout    H/O degenerative disc disease    Hyperlipidemia    Hypertension     Surgical History: Past Surgical History:  Procedure Laterality Date   APPENDECTOMY  1985   cholecystectomy  1985   COLONOSCOPY     COLONOSCOPY WITH PROPOFOL N/A 10/29/2021   Procedure: COLONOSCOPY WITH PROPOFOL;  Surgeon: Eloise Harman, DO;  [facility];  Service: Endoscopy;  Laterality: N/A;  11:00AM, ASA 3   POLYPECTOMY  10/29/2021   Procedure: POLYPECTOMY;  Surgeon: Eloise Harman, DO;  [facility];  Service: Endoscopy;;   TONSILLECTOMY  1957    Home Medications:  Allergies as of 06/03/2022       Reactions    Amlodipine    Unknown reaction   Codeine    Unknown reaction   Influenza Vaccines Swelling   Local reaction at site of injection Can tolerate egg free version        Medication List        Accurate as of June 03, 2022 11:14 AM. If you have any questions, ask your nurse or doctor.          acetaminophen 325 MG tablet Commonly known as: TYLENOL Take 650 mg by mouth every 6 (six) hours as needed for moderate pain.   allopurinol 100 MG tablet Commonly known as: ZYLOPRIM Take 100 mg by mouth daily.   atorvastatin 80 MG tablet Commonly known as: LIPITOR Take 80 mg by mouth daily.   carvedilol 12.5 MG tablet Commonly known as: COREG Take 12.5 mg by mouth 2 (two) times daily.   cholecalciferol 25 MCG (1000 UNIT) tablet Commonly known as: VITAMIN D3 Take 1,000 Units by mouth daily.   cyanocobalamin 1000 MCG/ML injection Commonly known as: VITAMIN B12 Inject 1,000 mcg into the muscle every Friday.   empagliflozin 10 MG Tabs tablet Commonly known as: JARDIANCE Take 10 mg by mouth daily.   escitalopram 5 MG tablet Commonly known as: LEXAPRO Take 5 mg by mouth daily.   lisinopril 20 MG  tablet Commonly known as: ZESTRIL TAKE 1 TABLET BY MOUTH DAILY   loratadine 10 MG tablet Commonly known as: CLARITIN Take 10 mg by mouth daily as needed.   magnesium oxide 400 (240 Mg) MG tablet Commonly known as: MAG-OX Take 400 mg by mouth 2 (two) times daily.   metFORMIN 1000 MG tablet Commonly known as: GLUCOPHAGE Take 1,000 mg by mouth daily with breakfast.   omeprazole 20 MG capsule Commonly known as: PRILOSEC Take 20 mg by mouth daily.   polyethylene glycol 17 g packet Commonly known as: MIRALAX / GLYCOLAX Take 17 g by mouth daily as needed for moderate constipation.        Allergies:  Allergies  Allergen Reactions   Amlodipine     Unknown reaction   Codeine     Unknown reaction   Influenza Vaccines Swelling    Local reaction at site of  injection  Can tolerate egg free version    Family History: Family History  Problem Relation Age of Onset   Lung cancer Mother    Heart attack Father    Heart disease Father    Diabetes Sister     Social History:  reports that she has never smoked. She has never used smokeless tobacco. She reports that she does not currently use drugs. She reports that she does not drink alcohol.   Physical Exam: There were no vitals taken for this visit.  Constitutional:  Alert and oriented, No acute distress. HEENT: [place] AT, moist mucus membranes.  Trachea midline, no masses. Cardiovascular: No clubbing, cyanosis, or edema. Respiratory: Normal respiratory effort, no increased work of breathing. GI: Abdomen is soft, nontender, nondistended, no abdominal masses GU: No CVA tenderness Skin: No rashes, bruises or suspicious lesions. Neurologic: Grossly intact, no focal deficits, moving all 4 extremities. Psychiatric: Normal mood and affect.  Laboratory Data: Lab Results  Component Value Date   WBC 8.7 01/11/2022   HGB 12.0 01/11/2022   HCT 38.4 01/11/2022   MCV 96.0 01/11/2022   PLT 210 01/11/2022    Lab Results  Component Value Date   CREATININE 1.22 (H) 01/11/2022    No results found for: "PSA"  No results found for: "TESTOSTERONE"  No results found for: "HGBA1C"  Urinalysis No results found for: "COLORURINE", "APPEARANCEUR", "LABSPEC", "PHURINE", "GLUCOSEU", "HGBUR", "BILIRUBINUR", "KETONESUR", "PROTEINUR", "UROBILINOGEN", "NITRITE", "LEUKOCYTESUR"  No results found for: "LABMICR", "WBCUA", "RBCUA", "LABEPIT", "MUCUS", "BACTERIA"  Pertinent Imaging:   Results for orders placed during the hospital encounter of 08/03/21  US RENAL  Narrative CLINICAL DATA:  Hyponatremia. Hyperkalemia. Proteinuria due to type 2 diabetes. 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  EXAM: RENAL / URINARY TRACT ULTRASOUND COMPLETE  COMPARISON:  Noncontrast CT  04/18/2021  FINDINGS: Right Kidney:  Renal measurements: 11.2 x 5.3 x 4.6 cm = volume: 144 mL. Diffusely increased renal parenchymal echogenicity. No hydronephrosis. No visualized renal stone. The small lower pole cyst on prior CT is not well seen by ultrasound. Portions of the kidney are obscured by shadowing.  Left Kidney:  Renal measurements: 9.5 x 5.9 x 4.6 cm = volume: 134 mL. Diffusely increased renal parenchymal echogenicity. No hydronephrosis. The upper pole cyst on prior CT is not well seen on the current exam. There is a subcentimeter cyst arising from the lower pole. No evidence of solid lesion. No renal calculi.  Bladder:  Appears normal for degree of bladder distention. Bladder volume is 52 cc, patient voided prior to leaving the house.  Other:  None.  IMPRESSION:  1. Increased renal parenchymal echogenicity typical of chronic medical renal disease. 2. Small left renal cyst. Additional cysts on prior CT are not seen on the current exam.   Electronically Signed By: Keith Rake M.D. On: 08/04/2021 01:09  No valid procedures specified. No results found for this or any previous visit.  No results found for this or any previous visit.   Assessment & Plan:    Mixed incontinence - she is well balanced right now off meds for OAB and we will continue to monitor.   MH - disc renal US and cystoscopy and she will proceed.   No follow-ups on file.  Festus Aloe, MD  [facility]  [address] [place], [place] [address] [phone]

## 2022-06-03 NOTE — Progress Notes (Signed)
post void residual =0mL 

## 2022-06-04 DIAGNOSIS — N183 Chronic kidney disease, stage 3 unspecified: Secondary | ICD-10-CM | POA: Diagnosis not present

## 2022-06-04 DIAGNOSIS — I10 Essential (primary) hypertension: Secondary | ICD-10-CM | POA: Diagnosis not present

## 2022-06-04 DIAGNOSIS — D472 Monoclonal gammopathy: Secondary | ICD-10-CM | POA: Diagnosis not present

## 2022-06-04 DIAGNOSIS — I4510 Unspecified right bundle-branch block: Secondary | ICD-10-CM | POA: Diagnosis not present

## 2022-06-04 DIAGNOSIS — R55 Syncope and collapse: Secondary | ICD-10-CM | POA: Diagnosis not present

## 2022-06-04 DIAGNOSIS — E1140 Type 2 diabetes mellitus with diabetic neuropathy, unspecified: Secondary | ICD-10-CM | POA: Diagnosis not present

## 2022-06-04 DIAGNOSIS — R5381 Other malaise: Secondary | ICD-10-CM | POA: Diagnosis not present

## 2022-06-04 DIAGNOSIS — E7849 Other hyperlipidemia: Secondary | ICD-10-CM | POA: Diagnosis not present

## 2022-06-04 DIAGNOSIS — E538 Deficiency of other specified B group vitamins: Secondary | ICD-10-CM | POA: Diagnosis not present

## 2022-06-04 DIAGNOSIS — M109 Gout, unspecified: Secondary | ICD-10-CM | POA: Diagnosis not present

## 2022-06-04 DIAGNOSIS — E46 Unspecified protein-calorie malnutrition: Secondary | ICD-10-CM | POA: Diagnosis not present

## 2022-06-05 LAB — URINE CULTURE

## 2022-06-11 NOTE — Progress Notes (Addendum)
Chief Complaint: Patient was seen in consultation today for proteinuria; non-focal renal biopsy.   Referring Physician(s): Bhutani,Manpreet S  Supervising Physician: Dr. Dwaine Gale  Patient Status: [facility] - In-pt  History of Present Illness: Angelica Conway is a 72 y.o. 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 Secondary work up for proteinuria has been unrevealing.    Interventional Radiology has been asked to evaluate this patient for an image-guided non-focal renal biopsy for further work up.   Past Medical History:  Diagnosis Date   Abnormal EKG    Anxiety    Arthritis    Chronic kidney disease    stage 3   Complication of anesthesia    difficulty waking up   Diabetes mellitus without complication    Gout    H/O degenerative disc disease    Hyperlipidemia    Hypertension     Past Surgical History:  Procedure Laterality Date   APPENDECTOMY  1985   cholecystectomy  1985   COLONOSCOPY     COLONOSCOPY WITH PROPOFOL N/A 10/29/2021   Procedure: COLONOSCOPY WITH PROPOFOL;  Surgeon: Eloise Harman, DO;  [facility];  Service: Endoscopy;  Laterality: N/A;  11:00AM, ASA 3   POLYPECTOMY  10/29/2021   Procedure: POLYPECTOMY;  Surgeon: Eloise Harman, DO;  [facility];  Service: Endoscopy;;   TONSILLECTOMY  1957    Allergies: Amlodipine, Codeine, and Influenza vaccines  Medications: Prior to Admission medications   Medication Sig Start Date End Date Taking? Authorizing Provider  acetaminophen (TYLENOL) 325 MG tablet Take 650 mg by mouth every 6 (six) hours as needed for moderate pain.    [provider]  allopurinol (ZYLOPRIM) 100 MG tablet Take 100 mg by mouth daily. 02/26/16 08/04/22  [provider]  atorvastatin (LIPITOR) 80 MG tablet Take 80 mg by mouth daily. 01/08/21   [provider]  carvedilol (COREG) 12.5 MG tablet Take 12.5 mg by mouth 2 (two) times daily. 08/01/21   [provider]  cholecalciferol (VITAMIN D3) 25 MCG (1000 UNIT) tablet Take 1,000 Units by mouth daily.    [provider]  cyanocobalamin (,VITAMIN B-12,) 1000 MCG/ML injection Inject 1,000 mcg into the muscle every Friday. 08/29/21   [provider]  empagliflozin (JARDIANCE) 10 MG TABS tablet Take 10 mg by mouth daily. 06/25/21   [provider]  escitalopram (LEXAPRO) 5 MG tablet Take 5 mg by mouth daily. 11/30/21   [provider]  lisinopril (ZESTRIL) 20 MG tablet TAKE 1 TABLET BY MOUTH DAILY 04/18/22   Arnoldo Lenis, MD  loratadine (CLARITIN) 10 MG tablet Take 10 mg by mouth daily as needed.    [provider]  magnesium oxide (MAG-OX) 400 (240 Mg) MG tablet Take 400 mg by mouth 2 (two) times daily. 07/27/21   [provider]  metFORMIN (GLUCOPHAGE) 1000 MG tablet Take 1,000 mg by mouth daily with breakfast. 02/26/16 08/04/22  [provider]  omeprazole (PRILOSEC) 20 MG capsule Take 20 mg by mouth daily.  12/18/15 08/04/22  [provider]  polyethylene glycol (MIRALAX / GLYCOLAX) 17 g packet Take 17 g by mouth daily as needed for moderate constipation.    [provider]     Family History  Problem Relation Age of Onset   Lung cancer Mother    Heart attack Father  Heart disease Father    Diabetes Sister     Social History   Socioeconomic History   Marital status: Widowed    Spouse name: Not on file   Number of children: Not on file   Years of education: Not on file   Highest education level: Not on file  Occupational History   Not on file  Tobacco Use   Smoking status: Never   Smokeless tobacco: Never  Vaping Use   Vaping Use: Never used  Substance and Sexual Activity   Alcohol use: No   Drug use: Not Currently   Sexual activity: Not Currently  Other Topics Concern   Not on  file  Social History Narrative   Not on file   Social Determinants of Health   Financial Resource Strain: Not on file  Food Insecurity: Not on file  Transportation Needs: Not on file  Physical Activity: Not on file  Stress: Not on file  Social Connections: Not on file    Review of Systems: A 12 point ROS discussed and pertinent positives are indicated in the HPI above.  All other systems are negative.  Review of Systems  Constitutional:  Negative for appetite change and fatigue.  Respiratory:  Negative for cough and shortness of breath.   Cardiovascular:  Negative for chest pain and leg swelling.  Gastrointestinal:  Negative for abdominal pain, diarrhea, nausea and vomiting.  Genitourinary:  Positive for frequency.  Musculoskeletal:  Negative for back pain.  Neurological:  Positive for dizziness. Negative for headaches.    Vital Signs: BP (!) 197/84   Pulse 65   Temp 98 F (36.7 C) (Oral)   Resp 15   Ht 5\' 2"  (1.575 m)   Wt 150 lb (68 kg)   SpO2 98%   BMI 27.44 kg/m   Physical Exam Constitutional:      General: She is not in acute distress.    Appearance: She is not ill-appearing.  HENT:     Mouth/Throat:     Mouth: Mucous membranes are moist.     Pharynx: Oropharynx is clear.  Cardiovascular:     Rate and Rhythm: Normal rate and regular rhythm.     Pulses: Normal pulses.     Heart sounds: Normal heart sounds.  Pulmonary:     Effort: Pulmonary effort is normal.     Breath sounds: Normal breath sounds.  Abdominal:     General: Bowel sounds are normal.     Palpations: Abdomen is soft.     Tenderness: There is no abdominal tenderness.  Musculoskeletal:     Right lower leg: No edema.     Left lower leg: No edema.  Skin:    General: Skin is warm and dry.  Neurological:     Mental Status: She is alert and oriented to person, place, and time.  Psychiatric:        Mood and Affect: Mood normal.        Behavior: Behavior normal.        Thought Content: Thought  content normal.        Judgment: Judgment normal.     Imaging: No results found.  Labs:  CBC: Recent Labs    01/11/22 1137 06/17/22 0630  WBC 8.7 8.3  HGB 12.0 10.8*  HCT 38.4 36.2  PLT 210 170    COAGS: Recent Labs    06/17/22 0630  INR 1.0    BMP: Recent Labs    10/25/21 1457 01/11/22 1137  NA 138  140  K 5.0 4.2  CL 107 107  CO2 23 21*  GLUCOSE 77 126*  BUN 22 33*  CALCIUM 9.6 9.8  CREATININE 1.09* 1.22*  GFRNONAA 54* 47*    LIVER FUNCTION TESTS: Recent Labs    01/11/22 1137  BILITOT 0.4  AST 33  ALT 28  ALKPHOS 68  PROT 7.3  ALBUMIN 3.7    TUMOR MARKERS: No results for input(s): "AFPTM", "CEA", "CA199", "CHROMGRNA" in the last 8760 hours.  Assessment and Plan:  Chronic kidney disease; nephrotic range proteinuria: Angelica Conway, 72 year old female, presents today to the [facility] department for an image-guided non-focal renal biopsy. Her blood pressure is elevated this morning because she did not take her blood pressure medicine this morning. These drugs have been administered. If her blood pressure is not reduced to an acceptable level the procedure will need to be rescheduled.   Risks and benefits of this procedure were discussed with the patient and/or patient's family including, but not limited to bleeding, infection, damage to adjacent structures or low yield requiring additional tests.  All of the questions were answered and there is agreement to proceed. She has been NPO. She does not take any blood-thinning medications. She is a full code.   Consent signed and in chart.  Thank you for this interesting consult.  I greatly enjoyed meeting Angelica Conway and look forward to participating in their care.  A copy of this report was sent to the requesting provider on this date.  Electronically Signed: Soyla Dryer, AGACNP-BC [phone] 06/17/2022, 7:27 AM   I spent a total of  30 Minutes   in face to face in  clinical consultation, greater than 50% of which was counseling/coordinating care for non-focal renal biopsy.

## 2022-06-13 ENCOUNTER — Other Ambulatory Visit: Payer: Self-pay | Admitting: Radiology

## 2022-06-13 DIAGNOSIS — N189 Chronic kidney disease, unspecified: Secondary | ICD-10-CM

## 2022-06-14 ENCOUNTER — Other Ambulatory Visit: Payer: Self-pay | Admitting: Student

## 2022-06-17 ENCOUNTER — Ambulatory Visit (HOSPITAL_COMMUNITY)
Admission: RE | Admit: 2022-06-17 | Discharge: 2022-06-17 | Disposition: A | Discharge status: Home / Self Care | Payer: Medicare Other | Source: Ambulatory Visit | Attending: Nephrology | Admitting: Nephrology

## 2022-06-17 ENCOUNTER — Encounter (HOSPITAL_COMMUNITY): Payer: Self-pay

## 2022-06-17 DIAGNOSIS — N182 Chronic kidney disease, stage 2 (mild): Secondary | ICD-10-CM | POA: Insufficient documentation

## 2022-06-17 DIAGNOSIS — E1129 Type 2 diabetes mellitus with other diabetic kidney complication: Secondary | ICD-10-CM | POA: Insufficient documentation

## 2022-06-17 DIAGNOSIS — R809 Proteinuria, unspecified: Secondary | ICD-10-CM | POA: Insufficient documentation

## 2022-06-17 DIAGNOSIS — N189 Chronic kidney disease, unspecified: Secondary | ICD-10-CM | POA: Insufficient documentation

## 2022-06-17 LAB — PROTIME-INR
INR: 1 (ref 0.8–1.2)
Prothrombin Time: 13.2 seconds (ref 11.4–15.2)

## 2022-06-17 LAB — CBC
HCT: 36.2 % (ref 36.0–46.0)
Hemoglobin: 10.8 g/dL — ABNORMAL LOW (ref 12.0–15.0)
MCH: 28.1 pg (ref 26.0–34.0)
MCHC: 29.8 g/dL — ABNORMAL LOW (ref 30.0–36.0)
MCV: 94 fL (ref 80.0–100.0)
Platelets: 170 10*3/uL (ref 150–400)
RBC: 3.85 MIL/uL — ABNORMAL LOW (ref 3.87–5.11)
RDW: 15.1 % (ref 11.5–15.5)
WBC: 8.3 10*3/uL (ref 4.0–10.5)
nRBC: 0 % (ref 0.0–0.2)

## 2022-06-17 LAB — GLUCOSE, CAPILLARY: Glucose-Capillary: 116 mg/dL — ABNORMAL HIGH (ref 70–99)

## 2022-06-17 MED ORDER — SODIUM CHLORIDE 0.9 % IV SOLN: INTRAVENOUS | Status: DC

## 2022-06-26 ENCOUNTER — Other Ambulatory Visit: Payer: Self-pay | Admitting: Radiology

## 2022-06-26 DIAGNOSIS — N189 Chronic kidney disease, unspecified: Secondary | ICD-10-CM

## 2022-06-28 ENCOUNTER — Other Ambulatory Visit: Payer: Self-pay | Admitting: Student

## 2022-06-30 ENCOUNTER — Encounter (HOSPITAL_COMMUNITY): Payer: Self-pay

## 2022-06-30 NOTE — H&P (Signed)
Chief Complaint: Proteinuria with a decrease in urination. Team is requesting a random renal biopsy.   Referring Physician(s): Bhutani,Manpreet S  Supervising Physician: Richarda Overlie  Patient Status: [facility] - Out-pt  History of Present Illness: Angelica Conway is a 72 y.o. female outpatient. History of HTN. HLD, Gout, DM, CKD, subdural hematoma.. Now with proteinuria and a decrease in urination. Team is requesting a random renal biopsy for further evaluation.   Currently without any significant complaints. Patient alert and laying in bed,calm. Denies any fevers, headache, chest pain, SOB, cough, abdominal pain, nausea, vomiting or bleeding. Return precautions and treatment recommendations and follow-up discussed with the patient *** who is agreeable with the plan.    Past Medical History:  Diagnosis Date   Abnormal EKG    Anxiety    Arthritis    Chronic kidney disease    stage 3   Complication of anesthesia    difficulty waking up   Diabetes mellitus without complication    Gout    H/O degenerative disc disease    Hyperlipidemia    Hypertension     Past Surgical History:  Procedure Laterality Date   APPENDECTOMY  1985   cholecystectomy  1985   COLONOSCOPY     COLONOSCOPY WITH PROPOFOL N/A 10/29/2021   Procedure: COLONOSCOPY WITH PROPOFOL;  Surgeon: Lanelle Bal, DO;  [facility];  Service: Endoscopy;  Laterality: N/A;  11:00AM, ASA 3   POLYPECTOMY  10/29/2021   Procedure: POLYPECTOMY;  Surgeon: Lanelle Bal, DO;  [facility];  Service: Endoscopy;;   TONSILLECTOMY  1957    Allergies: Amlodipine, Codeine, and Influenza vaccines  Medications: Prior to Admission medications   Medication Sig Start Date End Date Taking? Authorizing Provider  acetaminophen (TYLENOL) 325 MG tablet Take 650 mg by mouth every 6 (six) hours as needed for moderate pain.    [provider]  allopurinol (ZYLOPRIM) 100 MG tablet Take 100 mg by mouth daily.  02/26/16 08/04/22  [provider]  atorvastatin (LIPITOR) 80 MG tablet Take 80 mg by mouth daily. 01/08/21   [provider]  carvedilol (COREG) 12.5 MG tablet Take 12.5 mg by mouth 2 (two) times daily. 08/01/21   [provider]  cholecalciferol (VITAMIN D3) 25 MCG (1000 UNIT) tablet Take 1,000 Units by mouth daily.    [provider]  cyanocobalamin (,VITAMIN B-12,) 1000 MCG/ML injection Inject 1,000 mcg into the muscle every Friday. 08/29/21   [provider]  empagliflozin (JARDIANCE) 10 MG TABS tablet Take 10 mg by mouth daily. 06/25/21   [provider]  escitalopram (LEXAPRO) 5 MG tablet Take 5 mg by mouth daily. 11/30/21   [provider]  lisinopril (ZESTRIL) 20 MG tablet TAKE 1 TABLET BY MOUTH DAILY 04/18/22   Antoine Poche, MD  loratadine (CLARITIN) 10 MG tablet Take 10 mg by mouth daily as needed.    [provider]  magnesium oxide (MAG-OX) 400 (240 Mg) MG tablet Take 400 mg by mouth 2 (two) times daily. 07/27/21   [provider]  metFORMIN (GLUCOPHAGE) 1000 MG tablet Take 1,000 mg by mouth daily with breakfast. 02/26/16 08/04/22  [provider]  omeprazole (PRILOSEC) 20 MG capsule Take 20 mg by mouth daily.  12/18/15 08/04/22  [provider]  polyethylene glycol (MIRALAX / GLYCOLAX) 17 g packet Take 17 g by mouth daily as needed for moderate constipation.    [provider]     Family History  Problem  Relation Age of Onset   Lung cancer Mother    Heart attack Father    Heart disease Father    Diabetes Sister     Social History   Socioeconomic History   Marital status: Widowed    Spouse name: Not on file   Number of children: Not on file   Years of education: Not on file   Highest education level: Not on file  Occupational History   Not on file  Tobacco Use   Smoking status: Never   Smokeless tobacco: Never  Vaping Use   Vaping Use: Never used  Substance and  Sexual Activity   Alcohol use: No   Drug use: Not Currently   Sexual activity: Not Currently  Other Topics Concern   Not on file  Social History Narrative   Not on file   Social Determinants of Health   Financial Resource Strain: Not on file  Food Insecurity: Not on file  Transportation Needs: Not on file  Physical Activity: Not on file  Stress: Not on file  Social Connections: Not on file    ECOG Status: {CHL ONC ECOG [id number]}  Review of Systems: A 12 point ROS discussed and pertinent positives are indicated in the HPI above.  All other systems are negative.  Review of Systems  Vital Signs: There were no vitals taken for this visit.  Advance Care Plan: {Advance Care [id number]}    Physical Exam  Imaging: No results found.  Labs:  CBC: Recent Labs    01/11/22 1137 06/17/22 0630  WBC 8.7 8.3  HGB 12.0 10.8*  HCT 38.4 36.2  PLT 210 170    COAGS: Recent Labs    06/17/22 0630  INR 1.0    BMP: Recent Labs    10/25/21 1457 01/11/22 1137  NA 138 140  K 5.0 4.2  CL 107 107  CO2 23 21*  GLUCOSE 77 126*  BUN 22 33*  CALCIUM 9.6 9.8  CREATININE 1.09* 1.22*  GFRNONAA 54* 47*    LIVER FUNCTION TESTS: Recent Labs    01/11/22 1137  BILITOT 0.4  AST 33  ALT 28  ALKPHOS 68  PROT 7.3  ALBUMIN 3.7     Assessment and Plan:  72 y.o. female outpatient. History of HTN. HLD, Gout, DM, CKD, subdural hematoma.. Now with proteinuria and a decrease in urination. Team is requesting a random renal biopsy for further evaluation.   US renal from 5.19.23 reads Increased renal parenchymal echogenicity typical of chronic medical renal disease. Small left renal cyst. Additional cysts on prior CT are not seen on the current exam. *** All labs and medications are within acceptable parameters. Allergies include codeine. Patient has been NPO since midnight.  Risks and benefits of random renal biopsy  was discussed with the patient and/or patient's family  including, but not limited to bleeding, infection, damage to adjacent structures or low yield requiring additional tests.  All of the questions were answered and there is agreement to proceed.  Consent signed and in chart.   Thank you for this interesting consult.  I greatly enjoyed meeting Angelica Conway and look forward to participating in their care.  A copy of this report was sent to the requesting provider on this date.  Electronically Signed: Alene Mires, NP 06/30/2022, 7:22 PM   I spent a total of  30 Minutes   in face to face in clinical consultation, greater than 50% of which was counseling/coordinating care for random renal biopsy

## 2022-07-01 ENCOUNTER — Encounter (HOSPITAL_COMMUNITY): Payer: Self-pay

## 2022-07-01 ENCOUNTER — Ambulatory Visit (HOSPITAL_COMMUNITY)
Admission: RE | Admit: 2022-07-01 | Discharge: 2022-07-01 | Disposition: A | Discharge status: Home / Self Care | Payer: Medicare Other | Source: Ambulatory Visit | Attending: Nephrology | Admitting: Nephrology

## 2022-07-01 DIAGNOSIS — I129 Hypertensive chronic kidney disease with stage 1 through stage 4 chronic kidney disease, or unspecified chronic kidney disease: Secondary | ICD-10-CM | POA: Diagnosis not present

## 2022-07-01 DIAGNOSIS — N281 Cyst of kidney, acquired: Secondary | ICD-10-CM | POA: Insufficient documentation

## 2022-07-01 DIAGNOSIS — M109 Gout, unspecified: Secondary | ICD-10-CM | POA: Insufficient documentation

## 2022-07-01 DIAGNOSIS — N182 Chronic kidney disease, stage 2 (mild): Secondary | ICD-10-CM | POA: Diagnosis not present

## 2022-07-01 DIAGNOSIS — E1122 Type 2 diabetes mellitus with diabetic chronic kidney disease: Secondary | ICD-10-CM | POA: Diagnosis not present

## 2022-07-01 DIAGNOSIS — R809 Proteinuria, unspecified: Secondary | ICD-10-CM | POA: Diagnosis not present

## 2022-07-01 DIAGNOSIS — N189 Chronic kidney disease, unspecified: Secondary | ICD-10-CM

## 2022-07-01 DIAGNOSIS — E785 Hyperlipidemia, unspecified: Secondary | ICD-10-CM | POA: Insufficient documentation

## 2022-07-01 LAB — PROTIME-INR
INR: 1.1 (ref 0.8–1.2)
Prothrombin Time: 13.9 seconds (ref 11.4–15.2)

## 2022-07-01 LAB — CBC
HCT: 38.7 % (ref 36.0–46.0)
Hemoglobin: 12.3 g/dL (ref 12.0–15.0)
MCH: 28.9 pg (ref 26.0–34.0)
MCHC: 31.8 g/dL (ref 30.0–36.0)
MCV: 91.1 fL (ref 80.0–100.0)
Platelets: 182 10*3/uL (ref 150–400)
RBC: 4.25 MIL/uL (ref 3.87–5.11)
RDW: 15 % (ref 11.5–15.5)
WBC: 8.8 10*3/uL (ref 4.0–10.5)
nRBC: 0 % (ref 0.0–0.2)

## 2022-07-01 LAB — GLUCOSE, CAPILLARY
Glucose-Capillary: 110 mg/dL — ABNORMAL HIGH (ref 70–99)
Glucose-Capillary: 105 mg/dL — ABNORMAL HIGH (ref 70–99)

## 2022-07-01 MED ORDER — SODIUM CHLORIDE 0.9 % IV SOLN: INTRAVENOUS | Status: DC

## 2022-07-01 MED ORDER — LIDOCAINE HCL (PF) 1 % IJ SOLN
10.0000 mL | Freq: Once | INTRAMUSCULAR | Status: AC
  Administered 2022-07-01: 10 mL via INTRADERMAL

## 2022-07-01 MED ORDER — MIDAZOLAM HCL 2 MG/2ML IJ SOLN
INTRAMUSCULAR | Status: AC | PRN
  Administered 2022-07-01: .5 mg via INTRAVENOUS
  Administered 2022-07-01: 1 mg via INTRAVENOUS

## 2022-07-01 MED ORDER — FENTANYL CITRATE (PF) 100 MCG/2ML IJ SOLN
INTRAMUSCULAR | Status: AC | PRN
  Administered 2022-07-01: 25 ug via INTRAVENOUS

## 2022-07-01 MED ORDER — FENTANYL CITRATE (PF) 100 MCG/2ML IJ SOLN
INTRAMUSCULAR | Status: AC
  Filled 2022-07-01: qty 2

## 2022-07-01 MED ORDER — HYDROCODONE-ACETAMINOPHEN 5-325 MG PO TABS: 1.0000 | ORAL_TABLET | ORAL | Status: DC | PRN

## 2022-07-01 MED ORDER — HYDRALAZINE HCL 20 MG/ML IJ SOLN
INTRAMUSCULAR | Status: AC
  Filled 2022-07-01: qty 1

## 2022-07-01 MED ORDER — GELATIN ABSORBABLE 12-7 MM EX MISC
1.0000 | Freq: Once | CUTANEOUS | Status: AC
  Administered 2022-07-01: 1 via TOPICAL

## 2022-07-01 MED ORDER — MIDAZOLAM HCL 2 MG/2ML IJ SOLN
INTRAMUSCULAR | Status: AC
  Filled 2022-07-01: qty 2

## 2022-07-01 NOTE — Procedures (Signed)
Interventional Radiology Procedure:   Indications: Chronic kidney disease with proteinuria   Procedure: US guided left renal biopsy  Findings: Single 18 gauge core biopsy from left kidney lower pole.  Adequate specimen obtained and placed in saline. Immediate perinephric bleeding was successfully stopped with gelfoam slurry.  No active bleeding at end of procedure.  Complications: None     EBL: Minimal  Plan: Strict bedrest 4 hours.  Anticipate discharge to home today.   Bulah Lurie R. Lowella Dandy, MD  Pager: [phone]

## 2022-07-05 ENCOUNTER — Encounter (HOSPITAL_COMMUNITY): Payer: Self-pay

## 2022-07-05 DIAGNOSIS — E538 Deficiency of other specified B group vitamins: Secondary | ICD-10-CM | POA: Diagnosis not present

## 2022-07-05 LAB — SURGICAL PATHOLOGY

## 2022-07-10 DIAGNOSIS — R55 Syncope and collapse: Secondary | ICD-10-CM | POA: Insufficient documentation

## 2022-07-11 ENCOUNTER — Ambulatory Visit: Payer: Medicare Other | Admitting: Neurology

## 2022-07-11 ENCOUNTER — Telehealth: Payer: Self-pay | Admitting: Neurology

## 2022-07-11 ENCOUNTER — Encounter: Payer: Self-pay | Admitting: Neurology

## 2022-07-11 VITALS — BP 129/69 | HR 66 | Ht 62.0 in | Wt 150.0 lb

## 2022-07-11 DIAGNOSIS — R55 Syncope and collapse: Secondary | ICD-10-CM

## 2022-07-11 DIAGNOSIS — G459 Transient cerebral ischemic attack, unspecified: Secondary | ICD-10-CM

## 2022-07-11 DIAGNOSIS — S065XAA Traumatic subdural hemorrhage with loss of consciousness status unknown, initial encounter: Secondary | ICD-10-CM | POA: Diagnosis not present

## 2022-07-11 NOTE — Progress Notes (Signed)
[facility]  PATIENT: Angelica Conway DOB: 05-17-1950  REQUESTING CLINICIAN: Royann Shivers, * HISTORY FROM: Patient and daughter REASON FOR VISIT: Recurrent syncope    HISTORICAL  CHIEF COMPLAINT:  Chief Complaint  Patient presents with   New Patient (Initial Visit)    Rm 13, daughter in law and grandson present  Syncope: while on the toilet sweats, nausea, lightheaded, sees light, shoulder heaviness and faint sometimes it last for 5 min or 30 until she comes to     HISTORY OF PRESENT ILLNESS:  This is a 72 year old woman with medical condition including hypertension, hyperlipidemia, diabetes mellitus, SIADH, CKD, anxiety, and abnormal EKG who is presenting with multiple syncopal episodes in the past year.  Patient reports for the past year she has been having syncopal episodes associated with using the bathroom.  Patient report most of the time when using the bathroom, sitting on the commode, she will have this sudden feeling of her vision going blank, she described as seeing bright light when with her eyes closed, then she will slumped over.  These episodes can last up from 5-minute up to 30 minutes.  On multiple occasion they called EMS, upon arrival EMS will report low blood pressure and low heart rate.  But subsequently when she is more awake her vital will normalize.  During her workup also she was found to have symptomatic bradycardia and some of her medication was discontinued including chlorthalidone, spironolactone and reduction of Lopressor, diltiazem with improvement of her heart rate She reported she still experiences these symptoms, she had 2 episodes this week.  She has seen her cardiologist diagnosed with vasovagal syncope.  She did have echocardiogram which was reported normal and EKG which was also reported normal.  I was not able to see the EKG on epic but patient did have a previous EKG in 2022 that showed a first-degree AV block and  bradycardia. She did also follow-up with her nephrologist who increased her water intake from 32 ounce to 48 ounce.  Other then her syncopal episode concerning for vasovagal syncope, patient also reports episodes concerning for TIA.  She reports last year she woke up with right facial numbness, did not go to the hospital.  She also had the issue of right leg weakness, stated that the right right leg was so weak that she had to pick it up with her arms.  She related to her lumbar stenosis.  The weakness has improved.  She is on Lipitor but not on any antiplatelets. She reported last April, she fell and was diagnosed with subdural hematoma.  She has not follow-up with neurology since then.   OTHER MEDICAL CONDITIONS: Hypertension, hyperlipidemia, diabetes mellitus, SIADH, CKD, anxiety   REVIEW OF SYSTEMS: Full 14 system review of systems performed and negative with exception of: As noted in the HPI   ALLERGIES: Allergies  Allergen Reactions   Amlodipine     Unknown reaction   Codeine     Unknown reaction   Influenza Vaccines Swelling    Local reaction at site of injection  Can tolerate egg free version    HOME MEDICATIONS: Outpatient Medications Prior to Visit  Medication Sig Dispense Refill   acetaminophen (TYLENOL) 325 MG tablet Take 650 mg by mouth every 6 (six) hours as needed for moderate pain.     allopurinol (ZYLOPRIM) 100 MG tablet Take 100 mg by mouth daily.     atorvastatin (LIPITOR) 80 MG tablet Take 80 mg by mouth daily.  carvedilol (COREG) 12.5 MG tablet Take 12.5 mg by mouth 2 (two) times daily.     cholecalciferol (VITAMIN D3) 25 MCG (1000 UNIT) tablet Take 1,000 Units by mouth daily.     cyanocobalamin (,VITAMIN B-12,) 1000 MCG/ML injection Inject 1,000 mcg into the muscle every Friday.     empagliflozin (JARDIANCE) 10 MG TABS tablet Take 10 mg by mouth daily.     escitalopram (LEXAPRO) 5 MG tablet Take 5 mg by mouth daily.     lisinopril (ZESTRIL) 20 MG tablet  TAKE 1 TABLET BY MOUTH DAILY 30 tablet 6   loratadine (CLARITIN) 10 MG tablet Take 10 mg by mouth daily as needed.     magnesium oxide (MAG-OX) 400 (240 Mg) MG tablet Take 400 mg by mouth 2 (two) times daily.     metFORMIN (GLUCOPHAGE) 1000 MG tablet Take 1,000 mg by mouth daily with breakfast.     omeprazole (PRILOSEC) 20 MG capsule Take 20 mg by mouth daily.      polyethylene glycol (MIRALAX / GLYCOLAX) 17 g packet Take 17 g by mouth daily as needed for moderate constipation.     No facility-administered medications prior to visit.    PAST MEDICAL HISTORY: Past Medical History:  Diagnosis Date   Abnormal EKG    Anxiety    Arthritis    Chronic kidney disease    stage 3   Complication of anesthesia    difficulty waking up   Diabetes mellitus without complication    Gout    H/O degenerative disc disease    Hyperlipidemia    Hypertension     PAST SURGICAL HISTORY: Past Surgical History:  Procedure Laterality Date   APPENDECTOMY  1985   cholecystectomy  1985   COLONOSCOPY     COLONOSCOPY WITH PROPOFOL N/A 10/29/2021   Procedure: COLONOSCOPY WITH PROPOFOL;  Surgeon: Lanelle Bal, DO;  [facility];  Service: Endoscopy;  Laterality: N/A;  11:00AM, ASA 3   POLYPECTOMY  10/29/2021   Procedure: POLYPECTOMY;  Surgeon: Lanelle Bal, DO;  Location: [facility];  Service: Endoscopy;;   TONSILLECTOMY  1957    FAMILY HISTORY: Family History  Problem Relation Age of Onset   Lung cancer Mother    Heart attack Father    Heart disease Father    Diabetes Sister     SOCIAL HISTORY: Social History   Socioeconomic History   Marital status: Widowed    Spouse name: Not on file   Number of children: 4   Years of education: Not on file   Highest education level: Not on file  Occupational History   Not on file  Tobacco Use   Smoking status: Never   Smokeless tobacco: Never  Vaping Use   Vaping Use: Never used  Substance and Sexual Activity   Alcohol use: No    Drug use: Not Currently   Sexual activity: Not Currently  Other Topics Concern   Not on file  Social History Narrative   Not on file   Social Determinants of Health   Financial Resource Strain: Not on file  Food Insecurity: Not on file  Transportation Needs: Not on file  Physical Activity: Not on file  Stress: Not on file  Social Connections: Not on file  Intimate Partner Violence: Not on file    PHYSICAL EXAM  GENERAL EXAM/CONSTITUTIONAL: Vitals:  Vitals:   07/11/22 0753  BP: 129/69  Pulse: 66  Weight: 150 lb (68 kg)  Height: 5\' 2"  (1.575 m)  Body mass index is 27.44 kg/m. Wt Readings from Last 3 Encounters:  07/11/22 150 lb (68 kg)  07/01/22 150 lb (68 kg)  06/17/22 150 lb (68 kg)   Patient is in no distress; well developed, nourished and groomed; neck is supple  MUSCULOSKELETAL: Gait, strength, tone, movements noted in Neurologic exam below  NEUROLOGIC: MENTAL STATUS:      No data to display         awake, alert, oriented to person, place and time recent and remote memory intact normal attention and concentration language fluent, comprehension intact, naming intact fund of knowledge appropriate  CRANIAL NERVE:  2nd, 3rd, 4th, 6th - Visual fields full to confrontation, extraocular muscles intact, no nystagmus 5th - facial sensation symmetric 7th - facial strength symmetric 8th - hearing intact 9th - palate elevates symmetrically, uvula midline 11th - shoulder shrug symmetric 12th - tongue protrusion midline  MOTOR:  normal bulk and tone, full strength in the BUE, BLE  SENSORY:  normal and symmetric to light touch  COORDINATION:  finger-nose-finger, fine finger movements normal  GAIT/STATION:  Deferred, in a wheelchair      DIAGNOSTIC DATA (LABS, IMAGING, TESTING) - I reviewed patient records, labs, notes, testing and imaging myself where available.  Lab Results  Component Value Date   WBC 8.8 07/01/2022   HGB 12.3 07/01/2022    HCT 38.7 07/01/2022   MCV 91.1 07/01/2022   PLT 182 07/01/2022      Component Value Date/Time   NA 140 01/11/2022 1137   K 4.2 01/11/2022 1137   CL 107 01/11/2022 1137   CO2 21 (L) 01/11/2022 1137   GLUCOSE 126 (H) 01/11/2022 1137   BUN 33 (H) 01/11/2022 1137   CREATININE 1.22 (H) 01/11/2022 1137   CALCIUM 9.8 01/11/2022 1137   PROT 7.3 01/11/2022 1137   ALBUMIN 3.7 01/11/2022 1137   AST 33 01/11/2022 1137   ALT 28 01/11/2022 1137   ALKPHOS 68 01/11/2022 1137   BILITOT 0.4 01/11/2022 1137   GFRNONAA 47 (L) 01/11/2022 1137   No results found for: "CHOL", "HDL", "LDLCALC", "LDLDIRECT", "TRIG", "CHOLHDL" No results found for: "HGBA1C" No results found for: "VITAMINB12" No results found for: "TSH"   CT Head 06/28/2021 1. No significant change in trace amount of left frontal parafalcine subdural hematoma or right frontal subarachnoid hemorrhage, as  above. No new acute findings are otherwise noted.  2. Chronic microvascular ischemic changes in the cerebral white  matter, as above   ASSESSMENT AND PLAN  72 y.o. year old female with medical history including hypertension, hyperlipidemia, diabetes mellitus, abnormal EKG, anxiety, CKD, gout who is presenting with multiple syncopal episodes in the past year concerning for vasovagal syncope.  During her workup she was also found to have bradycardia, her blood pressure medications have been changed with improvement of her heart rate.  But she still having episodes.  Last one was this week.  Again based on description these are likely vasovagal. I attempted a carotid massage but she was not symptomatic.  I did advise her to continue following with cardiology if she having additional event and plan at that time will be to consider longer monitoring.  She also has increased her fluid intake from 32 to 48 ounces.  I did advise her to drink at least 48 ounce of water every day.  In term of additional complaints concerning for TIA, and subdural  hematoma I will obtain a brain MRI to rule out strokes and to assess chronic microvascular  ischemic changes.  She is already on Lipitor, I did advise her to start aspirin 81 mg daily.  I will contact the patient to go over the results.  Continue to follow-up with your PCP and return as needed.   1. Syncope, unspecified syncope type   2. TIA (transient ischemic attack)   3. Subdural hematoma      Patient Instructions  MRI Brain for TIA and follow up on subdural hematoma  Increase fluid intake to 48 ounce as recommended by nephrology  Continue to follow up with Cardiology if you have additional episode, can consider longer cardiac monitoring Return as needed   Orders Placed This Encounter  Procedures   MR BRAIN WO CONTRAST    No orders of the defined types were placed in this encounter.   Return if symptoms worsen or fail to improve.    Windell Norfolk, MD 07/11/2022, 9:36 AM  [facility] [address] [place], [place] [address] [phone]

## 2022-07-11 NOTE — Patient Instructions (Addendum)
MRI Brain for TIA and follow up on subdural hematoma  Increase fluid intake to 48 ounce as recommended by nephrology  Continue to follow up with Cardiology if you have additional episode, can consider longer cardiac monitoring Return as needed

## 2022-07-11 NOTE — Telephone Encounter (Signed)
[employer] medicare NPR sent to [place] [phone] 

## 2022-07-25 ENCOUNTER — Other Ambulatory Visit: Payer: Self-pay

## 2022-07-25 ENCOUNTER — Inpatient Hospital Stay: Payer: Medicare Other | Attending: Physician Assistant

## 2022-07-25 DIAGNOSIS — E1122 Type 2 diabetes mellitus with diabetic chronic kidney disease: Secondary | ICD-10-CM | POA: Insufficient documentation

## 2022-07-25 DIAGNOSIS — I129 Hypertensive chronic kidney disease with stage 1 through stage 4 chronic kidney disease, or unspecified chronic kidney disease: Secondary | ICD-10-CM | POA: Diagnosis not present

## 2022-07-25 DIAGNOSIS — D508 Other iron deficiency anemias: Secondary | ICD-10-CM

## 2022-07-25 DIAGNOSIS — D472 Monoclonal gammopathy: Secondary | ICD-10-CM | POA: Insufficient documentation

## 2022-07-25 DIAGNOSIS — R7402 Elevation of levels of lactic acid dehydrogenase (LDH): Secondary | ICD-10-CM | POA: Insufficient documentation

## 2022-07-25 DIAGNOSIS — R3129 Other microscopic hematuria: Secondary | ICD-10-CM | POA: Diagnosis not present

## 2022-07-25 DIAGNOSIS — C9000 Multiple myeloma not having achieved remission: Secondary | ICD-10-CM

## 2022-07-25 DIAGNOSIS — R339 Retention of urine, unspecified: Secondary | ICD-10-CM

## 2022-07-25 DIAGNOSIS — Z801 Family history of malignant neoplasm of trachea, bronchus and lung: Secondary | ICD-10-CM | POA: Insufficient documentation

## 2022-07-25 DIAGNOSIS — R768 Other specified abnormal immunological findings in serum: Secondary | ICD-10-CM | POA: Diagnosis not present

## 2022-07-25 DIAGNOSIS — N183 Chronic kidney disease, stage 3 unspecified: Secondary | ICD-10-CM | POA: Diagnosis not present

## 2022-07-25 DIAGNOSIS — E1140 Type 2 diabetes mellitus with diabetic neuropathy, unspecified: Secondary | ICD-10-CM | POA: Insufficient documentation

## 2022-07-25 DIAGNOSIS — D500 Iron deficiency anemia secondary to blood loss (chronic): Secondary | ICD-10-CM

## 2022-07-25 DIAGNOSIS — Z8541 Personal history of malignant neoplasm of cervix uteri: Secondary | ICD-10-CM | POA: Insufficient documentation

## 2022-07-25 LAB — URINALYSIS, ROUTINE W REFLEX MICROSCOPIC
Bilirubin Urine: NEGATIVE
Glucose, UA: >=500 mg/dL — AB
Hgb urine dipstick: NEGATIVE
Ketones, ur: NEGATIVE mg/dL
Nitrite: NEGATIVE
Protein, ur: >=300 mg/dL — AB
Specific Gravity, Urine: 1.02 (ref 1.005–1.030)
WBC, UA: >50 WBC/hpf (ref 0–5)
pH: 6 (ref 5.0–8.0)

## 2022-07-25 LAB — CBC WITH DIFFERENTIAL/PLATELET
Abs Immature Granulocytes: 0.02 10*3/uL (ref 0.00–0.07)
Basophils Absolute: 0.1 10*3/uL (ref 0.0–0.1)
Basophils Relative: 1 %
Eosinophils Absolute: 0.1 10*3/uL (ref 0.0–0.5)
Eosinophils Relative: 2 %
HCT: 36.7 % (ref 36.0–46.0)
Hemoglobin: 11.2 g/dL — ABNORMAL LOW (ref 12.0–15.0)
Immature Granulocytes: 0 %
Lymphocytes Relative: 26 %
Lymphs Abs: 2.1 10*3/uL (ref 0.7–4.0)
MCH: 28.8 pg (ref 26.0–34.0)
MCHC: 30.5 g/dL (ref 30.0–36.0)
MCV: 94.3 fL (ref 80.0–100.0)
Monocytes Absolute: 0.5 10*3/uL (ref 0.1–1.0)
Monocytes Relative: 6 %
Neutro Abs: 5.2 10*3/uL (ref 1.7–7.7)
Neutrophils Relative %: 65 %
Platelets: 227 10*3/uL (ref 150–400)
RBC: 3.89 MIL/uL (ref 3.87–5.11)
RDW: 16.3 % — ABNORMAL HIGH (ref 11.5–15.5)
WBC: 8 10*3/uL (ref 4.0–10.5)
nRBC: 0 % (ref 0.0–0.2)

## 2022-07-25 LAB — COMPREHENSIVE METABOLIC PANEL
ALT: 24 U/L (ref 0–44)
AST: 27 U/L (ref 15–41)
Albumin: 3.5 g/dL (ref 3.5–5.0)
Alkaline Phosphatase: 72 U/L (ref 38–126)
Anion gap: 9 (ref 5–15)
BUN: 25 mg/dL — ABNORMAL HIGH (ref 8–23)
CO2: 26 mmol/L (ref 22–32)
Calcium: 9.6 mg/dL (ref 8.9–10.3)
Chloride: 103 mmol/L (ref 98–111)
Creatinine, Ser: 1.32 mg/dL — ABNORMAL HIGH (ref 0.44–1.00)
GFR, Estimated: 43 mL/min — ABNORMAL LOW (ref >60)
Glucose, Bld: 131 mg/dL — ABNORMAL HIGH (ref 70–99)
Potassium: 4.4 mmol/L (ref 3.5–5.1)
Sodium: 138 mmol/L (ref 135–145)
Total Bilirubin: 0.8 mg/dL (ref 0.3–1.2)
Total Protein: 7.1 g/dL (ref 6.5–8.1)

## 2022-07-25 LAB — RENAL FUNCTION PANEL
Albumin: 3.5 g/dL (ref 3.5–5.0)
Anion gap: 10 (ref 5–15)
BUN: 24 mg/dL — ABNORMAL HIGH (ref 8–23)
CO2: 25 mmol/L (ref 22–32)
Calcium: 9.6 mg/dL (ref 8.9–10.3)
Chloride: 104 mmol/L (ref 98–111)
Creatinine, Ser: 1.36 mg/dL — ABNORMAL HIGH (ref 0.44–1.00)
GFR, Estimated: 42 mL/min — ABNORMAL LOW (ref >60)
Glucose, Bld: 132 mg/dL — ABNORMAL HIGH (ref 70–99)
Phosphorus: 4.1 mg/dL (ref 2.5–4.6)
Potassium: 4.5 mmol/L (ref 3.5–5.1)
Sodium: 139 mmol/L (ref 135–145)

## 2022-07-25 LAB — PROTEIN / CREATININE RATIO, URINE
Creatinine, Urine: 144 mg/dL
Protein Creatinine Ratio: 2.65 mg/mg{Cre} — ABNORMAL HIGH (ref 0.00–0.15)
Total Protein, Urine: 381 mg/dL

## 2022-07-25 LAB — LACTATE DEHYDROGENASE: LDH: 165 U/L (ref 98–192)

## 2022-07-26 LAB — KAPPA/LAMBDA LIGHT CHAINS
Kappa free light chain: 96.5 mg/L — ABNORMAL HIGH (ref 3.3–19.4)
Kappa, lambda light chain ratio: 1.69 — ABNORMAL HIGH (ref 0.26–1.65)
Lambda free light chains: 57.2 mg/L — ABNORMAL HIGH (ref 5.7–26.3)

## 2022-07-30 LAB — PROTEIN ELECTROPHORESIS, SERUM
A/G Ratio: 1.1 (ref 0.7–1.7)
Albumin ELP: 3.4 g/dL (ref 2.9–4.4)
Alpha-1-Globulin: 0.2 g/dL (ref 0.0–0.4)
Alpha-2-Globulin: 0.9 g/dL (ref 0.4–1.0)
Beta Globulin: 1.1 g/dL (ref 0.7–1.3)
Gamma Globulin: 1 g/dL (ref 0.4–1.8)
Globulin, Total: 3.2 g/dL (ref 2.2–3.9)
M-Spike, %: 0.3 g/dL — ABNORMAL HIGH
Total Protein ELP: 6.6 g/dL (ref 6.0–8.5)

## 2022-07-31 NOTE — Progress Notes (Unsigned)
[facility] [address], [place] [address]   CLINIC:  Medical Oncology/Hematology  PCP:  Sheela Stack [address] [place] [address] [phone]   REASON FOR VISIT:  Follow-up for abnormal SPEP   PRIOR THERAPY: None   CURRENT THERAPY: Surveillance   INTERVAL HISTORY:   Ms. Hirt 72 y.o. female returns for routine follow-up of her abnormal SPEP.  She was last seen by Rojelio Brenner PA-C on 01/31/2022.  At today's visit, she reports feeling fairly well.  No recent hospitalizations, surgeries, or changes in baseline health status.  She has some chronic "aches and pains," but denies any new onset bone pain or fractures.  She denies any B symptoms such as fever, chills, night sweats, unintentional weight loss.  She has intermittent dizziness and syncopal episodes, being worked up by PCP and cardiology, and thought to possibly be related to bradycardia.  She has some chronic neuropathy on the bottom of her feet, suspected to be secondary to diabetes.  No masses or lymphadenopathy per her report.  She has 50% energy and 100% appetite. She has gained some weight recently due to eating more than she used to after her Jardiance dose was decreased.   ASSESSMENT & PLAN:  1.  IgM lambda monoclonal gammopathy: - Patient seen at the request of Dr. Wolfgang Phoenix, who noted the following lab abnormalities: Immunofixation (07/25/2021): Faint IgM lambda monoclonal protein detected Free kappa light chains 47.5, free lambda light chains 48.1, ratio 0.99 SPEP (07/25/2021) poorly defined band of restricted protein mobility in the gammaglobulin - Hematology work-up (01/11/2022): Immunofixation shows polyclonal increase in 1 or more immunoglobulins SPEP negative for M spike Elevated kappa light chains 89.3, elevated lambda 55.2.  Normal ratio 1.62, in keeping with her underlying CKD. Mildly elevated beta-2 microglobulin 4.5, normal LDH. CMP (01/11/2022): Baseline  creatinine 1.22/GFR 47 in keeping with previously known CKD stage IIIa/b secondary to diabetes.  Normal calcium 9.8.  Hemoglobin normal at 12.0. - Skeletal survey (01/11/2022): No suspicious lytic or sclerotic lesions noted. - She denies any new bone pain or neurologic changes.  No B symptoms.   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 - Most recent MGUS/myeloma labs (07/25/2022): Immunofixation shows polyclonal increase in immunoglobulins SPEP with M spike 0.3%  Elevated FLC ratio 1.69 (kappa 96.5, lambda 57.2), although this is difficult to interpret in the setting of CKD. Normal LDH. Baseline creatinine 1.32/GFR 43.  Hgb 11.2.  Calcium 9.6. - Patient continues to display evidence of MGUS, without meeting criteria for multiple myeloma or necessitating bone marrow biopsy at this time. - PLAN: Will check 24-hour urine with UPEP/UIFE - Repeat labs + skeletal survey in 6 months.  Office visit 1 week after labs. - Consider bone marrow aspiration biopsy if she develops CRAB features or any significant M spike >1.0 g    2.  Stage III CKD: - CKD since 2022, thought to be secondary to diabetes mellitus. - Nonnephrotic range proteinuria, 24-hour protein 1.3 g - Renal ultrasound (08/03/2021): Increased renal parenchymal echogenicity typical of chronic medical renal disease. - She had kidney biopsy on 07/01/2022, results pending.   3.  Social/family history: - Personal history of cervical cancer in her 16s, treated with D&C and conization - OTHER PMH: Anxiety/depression, type 2 diabetes mellitus, hypertension, hyperlipidemia, CKD, arthritis - Lives at home with son and daughter-in-law.  Mostly sits in chair in the bed.  Movement is limited by dizziness  and right leg problem.  Walks with help of walker.  Worked as a Automotive engineer prior to retirement.  Non-smoker. - No family history of multiple myeloma.  2 sisters had cervical cancer and 1 sister had  lung cancer.   PLAN SUMMARY: >> 24-hour urine/UPEP/UIFE >> Labs in 6 months (CBC/D, CMP, LDH, SPEP, immunofixation, light chains) >> Skeletal survey (Xrays) in 6 months >> Office visit in 6 months, 1 week after labs     REVIEW OF SYSTEMS:   Review of Systems  Constitutional:  Positive for fatigue. Negative for appetite change, chills, diaphoresis, fever and unexpected weight change.  HENT:   Negative for lump/mass and nosebleeds.   Eyes:  Negative for eye problems.  Respiratory:  Negative for cough, hemoptysis and shortness of breath.   Cardiovascular:  Negative for chest pain, leg swelling and palpitations.  Gastrointestinal:  Positive for constipation. Negative for abdominal pain, blood in stool, diarrhea, nausea and vomiting.  Genitourinary:  Positive for frequency. Negative for hematuria.   Skin: Negative.   Neurological:  Positive for dizziness and numbness. Negative for headaches and light-headedness.  Hematological:  Does not bruise/bleed easily.     PHYSICAL EXAM:  ECOG PERFORMANCE STATUS: 2 - Symptomatic, <50% confined to bed  There were no vitals filed for this visit. There were no vitals filed for this visit. Physical Exam Constitutional:      Appearance: Normal appearance. She is obese.  Cardiovascular:     Heart sounds: Normal heart sounds.  Pulmonary:     Breath sounds: Normal breath sounds.  Neurological:     General: No focal deficit present.     Mental Status: Mental status is at baseline.  Psychiatric:        Behavior: Behavior normal. Behavior is cooperative.     PAST MEDICAL/SURGICAL HISTORY:  Past Medical History:  Diagnosis Date   Abnormal EKG    Anxiety    Arthritis    Chronic kidney disease    stage 3   Complication of anesthesia    difficulty waking up   Diabetes mellitus without complication (HCC)    Gout    H/O degenerative disc disease    Hyperlipidemia    Hypertension    Past Surgical History:  Procedure Laterality Date    APPENDECTOMY  1985   cholecystectomy  1985   COLONOSCOPY     COLONOSCOPY WITH PROPOFOL N/A 10/29/2021   Procedure: COLONOSCOPY WITH PROPOFOL;  Surgeon: Lanelle Bal, DO;  [facility]: [facility];  Service: Endoscopy;  Laterality: N/A;  11:00AM, ASA 3   POLYPECTOMY  10/29/2021   Procedure: POLYPECTOMY;  Surgeon: Lanelle Bal, DO;  [facility]: [facility];  Service: Endoscopy;;   TONSILLECTOMY  1957    SOCIAL HISTORY:  Social History   Socioeconomic History   Marital status: Widowed    Spouse name: Not on file   Number of children: 4   Years of education: Not on file   Highest education level: Not on file  Occupational History   Not on file  Tobacco Use   Smoking status: Never   Smokeless tobacco: Never  Vaping Use   Vaping Use: Never used  Substance and Sexual Activity   Alcohol use: No   Drug use: Not Currently   Sexual activity: Not Currently  Other Topics Concern   Not on file  Social History Narrative   Not on file   Social Determinants of Health   Financial Resource Strain: Not on file  Food  Insecurity: Not on file  Transportation Needs: Not on file  Physical Activity: Not on file  Stress: Not on file  Social Connections: Not on file  Intimate Partner Violence: Not on file    FAMILY HISTORY:  Family History  Problem Relation Age of Onset   Lung cancer Mother    Heart attack Father    Heart disease Father    Diabetes Sister     CURRENT MEDICATIONS:  Outpatient Encounter Medications as of 08/01/2022  Medication Sig   acetaminophen (TYLENOL) 325 MG tablet Take 650 mg by mouth every 6 (six) hours as needed for moderate pain.   allopurinol (ZYLOPRIM) 100 MG tablet Take 100 mg by mouth daily.   atorvastatin (LIPITOR) 80 MG tablet Take 80 mg by mouth daily.   carvedilol (COREG) 12.5 MG tablet Take 12.5 mg by mouth 2 (two) times daily.   cholecalciferol (VITAMIN D3) 25 MCG (1000 UNIT) tablet Take 1,000 Units by mouth daily.   cyanocobalamin (,VITAMIN  B-12,) 1000 MCG/ML injection Inject 1,000 mcg into the muscle every Friday.   empagliflozin (JARDIANCE) 10 MG TABS tablet Take 10 mg by mouth daily.   escitalopram (LEXAPRO) 5 MG tablet Take 5 mg by mouth daily.   lisinopril (ZESTRIL) 20 MG tablet TAKE 1 TABLET BY MOUTH DAILY   loratadine (CLARITIN) 10 MG tablet Take 10 mg by mouth daily as needed.   magnesium oxide (MAG-OX) 400 (240 Mg) MG tablet Take 400 mg by mouth 2 (two) times daily.   metFORMIN (GLUCOPHAGE) 1000 MG tablet Take 1,000 mg by mouth daily with breakfast.   omeprazole (PRILOSEC) 20 MG capsule Take 20 mg by mouth daily.    polyethylene glycol (MIRALAX / GLYCOLAX) 17 g packet Take 17 g by mouth daily as needed for moderate constipation.   No facility-administered encounter medications on file as of 08/01/2022.    ALLERGIES:  Allergies  Allergen Reactions   Amlodipine     Unknown reaction   Codeine     Unknown reaction   Influenza Vaccines Swelling    Local reaction at site of injection  Can tolerate egg free version    LABORATORY DATA:  I have reviewed the labs as listed.  CBC    Component Value Date/Time   WBC 8.0 07/25/2022 1041   RBC 3.89 07/25/2022 1041   HGB 11.2 (L) 07/25/2022 1041   HCT 36.7 07/25/2022 1041   PLT 227 07/25/2022 1041   MCV 94.3 07/25/2022 1041   MCH 28.8 07/25/2022 1041   MCHC 30.5 07/25/2022 1041   RDW 16.3 (H) 07/25/2022 1041   LYMPHSABS 2.1 07/25/2022 1041   MONOABS 0.5 07/25/2022 1041   EOSABS 0.1 07/25/2022 1041   BASOSABS 0.1 07/25/2022 1041      Latest Ref Rng & Units 07/25/2022   11:01 AM 07/25/2022   10:41 AM 01/11/2022   11:37 AM  CMP  Glucose 70 - 99 mg/dL 161  096  045   BUN 8 - 23 mg/dL 24  25  33   Creatinine 0.44 - 1.00 mg/dL 4.09  8.11  9.14   Sodium 135 - 145 mmol/L 139  138  140   Potassium 3.5 - 5.1 mmol/L 4.5  4.4  4.2   Chloride 98 - 111 mmol/L 104  103  107   CO2 22 - 32 mmol/L 25  26  21    Calcium 8.9 - 10.3 mg/dL 9.6  9.6  9.8   Total Protein 6.5 - 8.1  g/dL  7.1  7.3  Total Bilirubin 0.3 - 1.2 mg/dL  0.8  0.4   Alkaline Phos 38 - 126 U/L  72  68   AST 15 - 41 U/L  27  33   ALT 0 - 44 U/L  24  28     DIAGNOSTIC IMAGING:  I have independently reviewed the relevant imaging and discussed with the patient.   WRAP UP:  All questions were answered. The patient knows to call the clinic with any problems, questions or concerns.  Medical decision making: Moderate  Time spent on visit: I spent 20 minutes counseling the patient face to face. The total time spent in the appointment was 30 minutes and more than 50% was on counseling.  Carnella Guadalajara, PA-C  08/01/22 11:53 AM

## 2022-08-01 ENCOUNTER — Ambulatory Visit: Payer: Medicare Other | Admitting: Physician Assistant

## 2022-08-01 ENCOUNTER — Encounter: Payer: Self-pay | Admitting: Physician Assistant

## 2022-08-01 ENCOUNTER — Inpatient Hospital Stay: Payer: Medicare Other | Admitting: Physician Assistant

## 2022-08-01 ENCOUNTER — Ambulatory Visit (HOSPITAL_COMMUNITY)
Admission: RE | Admit: 2022-08-01 | Discharge: 2022-08-01 | Disposition: A | Discharge status: Home / Self Care | Payer: Medicare Other | Source: Ambulatory Visit | Attending: Urology | Admitting: Urology

## 2022-08-01 ENCOUNTER — Other Ambulatory Visit: Payer: Self-pay

## 2022-08-01 VITALS — BP 142/71 | HR 70 | Temp 97.8°F | Resp 18 | Wt 155.0 lb

## 2022-08-01 DIAGNOSIS — R768 Other specified abnormal immunological findings in serum: Secondary | ICD-10-CM | POA: Diagnosis not present

## 2022-08-01 DIAGNOSIS — D472 Monoclonal gammopathy: Secondary | ICD-10-CM

## 2022-08-01 DIAGNOSIS — R3129 Other microscopic hematuria: Secondary | ICD-10-CM

## 2022-08-01 DIAGNOSIS — R338 Other retention of urine: Secondary | ICD-10-CM | POA: Diagnosis not present

## 2022-08-01 DIAGNOSIS — E1122 Type 2 diabetes mellitus with diabetic chronic kidney disease: Secondary | ICD-10-CM | POA: Diagnosis not present

## 2022-08-01 DIAGNOSIS — N183 Chronic kidney disease, stage 3 unspecified: Secondary | ICD-10-CM | POA: Diagnosis not present

## 2022-08-01 DIAGNOSIS — E1140 Type 2 diabetes mellitus with diabetic neuropathy, unspecified: Secondary | ICD-10-CM | POA: Diagnosis not present

## 2022-08-01 DIAGNOSIS — R7402 Elevation of levels of lactic acid dehydrogenase (LDH): Secondary | ICD-10-CM | POA: Diagnosis not present

## 2022-08-01 DIAGNOSIS — D638 Anemia in other chronic diseases classified elsewhere: Secondary | ICD-10-CM | POA: Diagnosis not present

## 2022-08-01 DIAGNOSIS — N281 Cyst of kidney, acquired: Secondary | ICD-10-CM | POA: Diagnosis not present

## 2022-08-01 DIAGNOSIS — Z801 Family history of malignant neoplasm of trachea, bronchus and lung: Secondary | ICD-10-CM | POA: Diagnosis not present

## 2022-08-01 DIAGNOSIS — I129 Hypertensive chronic kidney disease with stage 1 through stage 4 chronic kidney disease, or unspecified chronic kidney disease: Secondary | ICD-10-CM | POA: Diagnosis not present

## 2022-08-01 LAB — IMMUNOFIXATION ELECTROPHORESIS
IgA: 281 mg/dL (ref 64–422)
IgG (Immunoglobin G), Serum: 870 mg/dL (ref 586–1602)
IgM (Immunoglobulin M), Srm: 336 mg/dL — ABNORMAL HIGH (ref 26–217)
Total Protein ELP: 6.5 g/dL (ref 6.0–8.5)

## 2022-08-01 NOTE — Patient Instructions (Signed)
[facility] at [facility] **VISIT SUMMARY & IMPORTANT INSTRUCTIONS **   You were seen today by Rojelio Brenner PA-C for your follow-up visit.    MGUS: As we discussed, you see [place] due to your abnormal protein ("MGUS," or "monoclonal gammopathy of undetermined significance").  This is a precancerous condition, but may never turn into cancer. We did not see any sign of myeloma cancer your blood work at this visit. You do have the MGUS protein present.  We will check a 24-hour urine study. We will check these labs again in 6 months to make sure you have not had any major changes.  We will also check Xrays in 6 months.  Continue to follow-up with your primary care doctor and other specialists regarding your other ongoing symptoms.   FOLLOW-UP APPOINTMENT: Office visit in 6 months, 1 week after labs  ** Thank you for trusting me with your healthcare!  I strive to provide all of my patients with quality care at each visit.  If you receive a survey for this visit, I would be so grateful to you for taking the time to provide feedback.  Thank you in advance!  ~ Tayvin Preslar                   Dr. Doreatha Massed   &   Rojelio Brenner, PA-C   - - - - - - - - - - - - - - - - - -    Thank you for choosing [facility] at [facility] to provide your oncology and hematology care.  To afford each patient quality time with our provider, please arrive at least 15 minutes before your scheduled appointment time.   If you have a lab appointment with the Cancer Center please come in thru the Main Entrance and check in at the main information desk.  You need to re-schedule your appointment should you arrive 10 or more minutes late.  We strive to give you quality time with our providers, and arriving late affects you and other patients whose appointments are after yours.  Also, if you no show three or more times for appointments you may be dismissed from the clinic  at the providers discretion.     Again, thank you for choosing [facility].  Our hope is that these requests will decrease the amount of time that you wait before being seen by our physicians.       _____________________________________________________________  Should you have questions after your visit to [facility], please contact our office at [phone] and follow the prompts.  Our office hours are 8:00 a.m. and 4:30 p.m. Monday - Friday.  Please note that voicemails left after 4:00 p.m. may not be returned until the following business day.  We are closed weekends and major holidays.  You do have access to a nurse 24-7, just call the main number to the clinic [phone] and do not press any options, hold on the line and a nurse will answer the phone.    For prescription refill requests, have your pharmacy contact our office and allow 72 hours.

## 2022-08-02 ENCOUNTER — Ambulatory Visit (HOSPITAL_COMMUNITY)
Admission: RE | Admit: 2022-08-02 | Discharge: 2022-08-02 | Disposition: A | Discharge status: Home / Self Care | Payer: Medicare Other | Source: Ambulatory Visit | Attending: Neurology | Admitting: Neurology

## 2022-08-02 DIAGNOSIS — S065XAA Traumatic subdural hemorrhage with loss of consciousness status unknown, initial encounter: Secondary | ICD-10-CM | POA: Diagnosis not present

## 2022-08-02 DIAGNOSIS — G459 Transient cerebral ischemic attack, unspecified: Secondary | ICD-10-CM | POA: Diagnosis not present

## 2022-08-02 DIAGNOSIS — J3489 Other specified disorders of nose and nasal sinuses: Secondary | ICD-10-CM | POA: Diagnosis not present

## 2022-08-02 DIAGNOSIS — E538 Deficiency of other specified B group vitamins: Secondary | ICD-10-CM | POA: Diagnosis not present

## 2022-08-05 ENCOUNTER — Ambulatory Visit: Payer: Medicare Other | Admitting: Urology

## 2022-08-05 ENCOUNTER — Encounter: Payer: Self-pay | Admitting: Urology

## 2022-08-05 VITALS — BP 136/87 | HR 86

## 2022-08-05 DIAGNOSIS — R3129 Other microscopic hematuria: Secondary | ICD-10-CM | POA: Diagnosis not present

## 2022-08-05 DIAGNOSIS — R339 Retention of urine, unspecified: Secondary | ICD-10-CM | POA: Diagnosis not present

## 2022-08-05 DIAGNOSIS — N3946 Mixed incontinence: Secondary | ICD-10-CM

## 2022-08-05 MED ORDER — CIPROFLOXACIN HCL 500 MG PO TABS
500.0000 mg | ORAL_TABLET | Freq: Once | ORAL | Status: AC
Start: 2022-08-05 — End: 2022-08-05
  Administered 2022-08-05: 500 mg via ORAL

## 2022-08-05 NOTE — Progress Notes (Signed)
    08/05/22  CC: No chief complaint on file.   HPI:  1) Mixed incontinence-she has a history of mixed incontinence and saw Dr. Benancio Deeds.  She tried tolterodine but had dry mouth.  She tried [place] which worked well but it was expensive.  She was on Myrbetriq.  She voids with a good stream. She has frequency and urgency. She can make it 2 hours. Standing triggers. She wears a pad. She managers well.   2) Urinary retention-she has a history of urinary retention episodes.  Last was in March 2023 when she was in hospital for hyponatremia with encephalopathy but she passed a voiding trial.  A May 2023 renal ultrasound by Dr. Wolfgang Phoenix revealed no hydronephrosis, very small left renal cyst, bladder 51 mL.  3) MH - noted on UA Mar 2024. A Feb 2023 CT was benign at [facility]. Her Mar 2024 cr 1.13 gfr 52.      Today, seen for the above. No dysuria or gross hematuria.     She walks with a walker at home but going out uses wheelchair.   There were no vitals taken for this visit. NED. A&Ox3.   No respiratory distress   Abd soft, NT, ND Normal external genitalia with patent urethral meatus No prolapse, moderate vaginal atrophy Bladder and urethra palpably normal   Chaperone - cystoscopy and exam - Shaniqua  Cystoscopy Procedure Note  Patient identification was confirmed, informed consent was obtained, and patient was prepped using Betadine solution.  Lidocaine jelly was administered per urethral meatus.    Procedure: - Flexible cystoscope introduced, without any difficulty.   - Thorough search of the bladder revealed:    normal urethral meatus    normal urothelium    no stones    no ulcers     no tumors    no urethral polyps    no trabeculation  - Ureteral orifices were normal in position and appearance.  Post-Procedure: - Patient tolerated the procedure well  Assessment/ Plan:  MH - benign eval. See in 1 year or sooner if issues.    No follow-ups on file.  Jerilee Field, MD

## 2022-08-06 ENCOUNTER — Encounter: Payer: Self-pay | Admitting: Cardiology

## 2022-08-06 ENCOUNTER — Ambulatory Visit: Payer: Medicare Other | Attending: Cardiology | Admitting: Cardiology

## 2022-08-06 VITALS — BP 174/92 | HR 74 | Ht 62.0 in | Wt 150.0 lb

## 2022-08-06 DIAGNOSIS — R55 Syncope and collapse: Secondary | ICD-10-CM | POA: Diagnosis not present

## 2022-08-06 DIAGNOSIS — I10 Essential (primary) hypertension: Secondary | ICD-10-CM

## 2022-08-06 LAB — URINALYSIS, ROUTINE W REFLEX MICROSCOPIC
Bilirubin, UA: NEGATIVE
Ketones, UA: NEGATIVE
Leukocytes,UA: NEGATIVE
Nitrite, UA: POSITIVE — AB
Specific Gravity, UA: 1.02 (ref 1.005–1.030)
Urobilinogen, Ur: 0.2 mg/dL (ref 0.2–1.0)
pH, UA: 6 (ref 5.0–7.5)

## 2022-08-06 LAB — MICROSCOPIC EXAMINATION

## 2022-08-06 NOTE — Progress Notes (Signed)
Clinical Summary Angelica Conway is a 72 y.o.female seen today for follow up of the following medical problems.    1. Bradycardia -ER visit [facility] 12/26/2020 with low HRs - was on cardizem and metoprolol at the time. Cardizem had recently been increased to 240mg  daily due to high bp's - from ER notes HRs in 30s, given atropine and calcium - HR imporved off meds   - 12/2021 monitor: some SVT, longest 15.6 seconds.      2. HTN - dilt and lopressor stopped after ER visit - was to start hydralazine 25mg  tid,a s well as lisinopril/HCTZ 40/25 - listed allergy to norvasc not specified. Leg edema per her report.   -02/2021 normal renin/aldo levels   - neprhology started coreg 07/21/21     -typically hme bp's 140s/80s  - Compliant with meds       3. CKD III - followed by Dr Wolfgang Phoenix   4. Hyponatremia - from neprhology notes component of [facility]   5. Syncope - seen 12/2021 by NP Dan Humphreys in clinic - at that time reported 3 episodes of syncope over 1.5 months - thought to be vasovagal syncope - 12/2021 echo: LVEF 60-65%, grade I dd,  -12/2021 heart monitor: 14 day monitor, 8 runs SVT longest 15.6 sec.  - does restrict her fluid intake   - recurrent syncope     - 4-5 episodes over the last 3-4 months - most recent episode 2 weeks ago. Was sitting on commode, felt like she needed to have a BM. Was not straining. Can feeling of pressure shoulders and neck, felt like she was getting ready to pass out. +LOC. Family came in and checked on her, had BM at the time. Was unresponsive for few minutes, came too and groggy   - prior episode in Sept. Occurred on commode, felt need to BM. Was not straining. Constipated, hard time passing BM. Pressure feeling, like getting ready to go out.    -episode when came out of bathroom, got up and open door and had episode of syncope.  - limiting fluid 1200 mL -has seen GI about constipation - she reports normal orthostatics   - last syncopal  episode in February - she was sitting on her bed playing cards.  - felt like needed to have BM. Got up walked to the bathroom with walker - sat on commode and was using it, next thing she knew she passed out.  - since that time neprhology increased her fluid intake. Increased to 48 ounces.      - since then some episodes of dizzines, presyncope.        Past Medical History:  Diagnosis Date   Abnormal EKG    Anxiety    Arthritis    Chronic kidney disease    stage 3   Complication of anesthesia    difficulty waking up   Diabetes mellitus without complication (HCC)    Gout    H/O degenerative disc disease    Hyperlipidemia    Hypertension      Allergies  Allergen Reactions   Amlodipine     Unknown reaction   Codeine     Unknown reaction   Influenza Vaccines Swelling    Local reaction at site of injection  Can tolerate egg free version     Current Outpatient Medications  Medication Sig Dispense Refill   acetaminophen (TYLENOL) 325 MG tablet Take 650 mg by mouth every 6 (six) hours as needed for moderate  pain.     allopurinol (ZYLOPRIM) 100 MG tablet Take 100 mg by mouth daily.     atorvastatin (LIPITOR) 80 MG tablet Take 80 mg by mouth daily.     carvedilol (COREG) 12.5 MG tablet Take 12.5 mg by mouth 2 (two) times daily.     cholecalciferol (VITAMIN D3) 25 MCG (1000 UNIT) tablet Take 1,000 Units by mouth daily.     cyanocobalamin (,VITAMIN B-12,) 1000 MCG/ML injection Inject 1,000 mcg into the muscle every Friday.     empagliflozin (JARDIANCE) 10 MG TABS tablet Take 10 mg by mouth daily.     escitalopram (LEXAPRO) 5 MG tablet Take 5 mg by mouth daily.     lisinopril (ZESTRIL) 20 MG tablet TAKE 1 TABLET BY MOUTH DAILY 30 tablet 6   loratadine (CLARITIN) 10 MG tablet Take 10 mg by mouth daily as needed.     magnesium oxide (MAG-OX) 400 (240 Mg) MG tablet Take 400 mg by mouth 2 (two) times daily.     metFORMIN (GLUCOPHAGE) 1000 MG tablet Take 1,000 mg by mouth  daily with breakfast.     omeprazole (PRILOSEC) 20 MG capsule Take 20 mg by mouth daily.      polyethylene glycol (MIRALAX / GLYCOLAX) 17 g packet Take 17 g by mouth daily as needed for moderate constipation.     No current facility-administered medications for this visit.     Past Surgical History:  Procedure Laterality Date   APPENDECTOMY  1985   cholecystectomy  1985   COLONOSCOPY     COLONOSCOPY WITH PROPOFOL N/A 10/29/2021   Procedure: COLONOSCOPY WITH PROPOFOL;  Surgeon: Lanelle Bal, DO;  Location: [facility];  Service: Endoscopy;  Laterality: N/A;  11:00AM, ASA 3   POLYPECTOMY  10/29/2021   Procedure: POLYPECTOMY;  Surgeon: Lanelle Bal, DO;  Location: [facility];  Service: Endoscopy;;   TONSILLECTOMY  1957     Allergies  Allergen Reactions   Amlodipine     Unknown reaction   Codeine     Unknown reaction   Influenza Vaccines Swelling    Local reaction at site of injection  Can tolerate egg free version      Family History  Problem Relation Age of Onset   Lung cancer Mother    Heart attack Father    Heart disease Father    Diabetes Sister      Social History Ms. Behney reports that she has never smoked. She has never used smokeless tobacco. Ms. Bobrowski reports no history of alcohol use.   Review of Systems CONSTITUTIONAL: No weight loss, fever, chills, weakness or fatigue.  HEENT: Eyes: No visual loss, blurred vision, double vision or yellow sclerae.No hearing loss, sneezing, congestion, runny nose or sore throat.  SKIN: No rash or itching.  CARDIOVASCULAR: per hpi RESPIRATORY: per hpi GASTROINTESTINAL: No anorexia, nausea, vomiting or diarrhea. No abdominal pain or blood.  GENITOURINARY: No burning on urination, no polyuria NEUROLOGICAL: No headache, dizziness, syncope, paralysis, ataxia, numbness or tingling in the extremities. No change in bowel or bladder control.  MUSCULOSKELETAL: No muscle, back pain, joint pain or stiffness.   LYMPHATICS: No enlarged nodes. No history of splenectomy.  PSYCHIATRIC: No history of depression or anxiety.  ENDOCRINOLOGIC: No reports of sweating, cold or heat intolerance. No polyuria or polydipsia.  Marland Kitchen   Physical Examination Today's Vitals   08/06/22 1550  BP: (!) 174/92  Pulse: 74  SpO2: 97%  Weight: 150 lb (68 kg)  Height: 5\' 2"  (1.575  m)   Body mass index is 27.44 kg/m.  Manual recheck 135/75  Gen: resting comfortably, no acute distress HEENT: no scleral icterus, pupils equal round and reactive, no palptable cervical adenopathy,  CV: RRR, no mrg, no jvd Resp: Clear to auscultation bilaterally GI: abdomen is soft, non-tender, non-distended, normal bowel sounds, no hepatosplenomegaly MSK: extremities are warm, no edema.  Skin: warm, no rash Neuro:  no focal deficits Psych: appropriate affect   Diagnostic Studies     Assessment and Plan   1.Syncope - unclear etiology, plan for 30 day monitor   2. HTN - up and down bp, manual recheck 135/75 is reasonable particularly given syncope of unclear etiology -   F/u 6 months     Antoine Poche, M.D.

## 2022-08-06 NOTE — Patient Instructions (Addendum)
Medication Instructions:   Continue all current medications.   Labwork:  none  Testing/Procedures:  Your physician has recommended that you wear a 30 day event monitor. Event monitors are medical devices that record the heart's electrical activity. Doctors most often [place] these monitors to diagnose arrhythmias. Arrhythmias are problems with the speed or rhythm of the heartbeat. The monitor is a small, portable device. You can wear one while you do your normal daily activities. This is usually used to diagnose what is causing palpitations/syncope (passing out). Office will contact with results via phone, letter or mychart.     Follow-Up:  6 months   Any Other Special Instructions Will Be Listed Below (If Applicable).  Low compression, below knee compression stockings - order given   If you need a refill on your cardiac medications before your next appointment, please call your pharmacy.

## 2022-08-09 NOTE — Progress Notes (Signed)
Spoke with daughter Doran Clay, discussed MRI results, informed her that MRI did not show evidence of bleeding. There is presence of small vessel disease and sinus disease. If she is having nasal congestion and sinus pain, that patient should follow up with PCP. She voices understanding.   Thanks  Dr. Teresa Coombs

## 2022-08-22 DIAGNOSIS — R55 Syncope and collapse: Secondary | ICD-10-CM | POA: Diagnosis not present

## 2022-08-27 ENCOUNTER — Other Ambulatory Visit: Payer: Self-pay | Admitting: *Deleted

## 2022-08-27 ENCOUNTER — Ambulatory Visit: Payer: Medicare Other | Attending: Cardiology

## 2022-08-27 ENCOUNTER — Other Ambulatory Visit: Payer: Self-pay | Admitting: Cardiology

## 2022-08-27 DIAGNOSIS — R55 Syncope and collapse: Secondary | ICD-10-CM

## 2022-09-02 DIAGNOSIS — E538 Deficiency of other specified B group vitamins: Secondary | ICD-10-CM | POA: Diagnosis not present

## 2022-09-05 DIAGNOSIS — R300 Dysuria: Secondary | ICD-10-CM | POA: Diagnosis not present

## 2022-09-05 DIAGNOSIS — R55 Syncope and collapse: Secondary | ICD-10-CM | POA: Diagnosis not present

## 2022-10-02 DIAGNOSIS — E538 Deficiency of other specified B group vitamins: Secondary | ICD-10-CM | POA: Diagnosis not present

## 2022-11-04 DIAGNOSIS — E538 Deficiency of other specified B group vitamins: Secondary | ICD-10-CM | POA: Diagnosis not present

## 2022-11-13 ENCOUNTER — Other Ambulatory Visit: Payer: Self-pay | Admitting: Cardiology

## 2022-11-28 ENCOUNTER — Other Ambulatory Visit (HOSPITAL_COMMUNITY)
Admission: RE | Admit: 2022-11-28 | Discharge: 2022-11-28 | Disposition: A | Discharge status: Home / Self Care | Payer: Medicare Other | Source: Ambulatory Visit | Attending: Nephrology | Admitting: Nephrology

## 2022-11-28 DIAGNOSIS — H35033 Hypertensive retinopathy, bilateral: Secondary | ICD-10-CM | POA: Diagnosis not present

## 2022-11-28 DIAGNOSIS — D638 Anemia in other chronic diseases classified elsewhere: Secondary | ICD-10-CM | POA: Diagnosis not present

## 2022-11-28 LAB — RENAL FUNCTION PANEL
Albumin: 3.6 g/dL (ref 3.5–5.0)
Anion gap: 11 (ref 5–15)
BUN: 28 mg/dL — ABNORMAL HIGH (ref 8–23)
CO2: 23 mmol/L (ref 22–32)
Calcium: 9.4 mg/dL (ref 8.9–10.3)
Chloride: 105 mmol/L (ref 98–111)
Creatinine, Ser: 1.58 mg/dL — ABNORMAL HIGH (ref 0.44–1.00)
GFR, Estimated: 35 mL/min — ABNORMAL LOW (ref >60)
Glucose, Bld: 119 mg/dL — ABNORMAL HIGH (ref 70–99)
Phosphorus: 3.5 mg/dL (ref 2.5–4.6)
Potassium: 4.4 mmol/L (ref 3.5–5.1)
Sodium: 139 mmol/L (ref 135–145)

## 2022-11-28 LAB — CBC WITH DIFFERENTIAL/PLATELET
Abs Immature Granulocytes: 0.02 10*3/uL (ref 0.00–0.07)
Basophils Absolute: 0.1 10*3/uL (ref 0.0–0.1)
Basophils Relative: 1 %
Eosinophils Absolute: 0.1 10*3/uL (ref 0.0–0.5)
Eosinophils Relative: 2 %
HCT: 38.6 % (ref 36.0–46.0)
Hemoglobin: 11.8 g/dL — ABNORMAL LOW (ref 12.0–15.0)
Immature Granulocytes: 0 %
Lymphocytes Relative: 33 %
Lymphs Abs: 2.8 10*3/uL (ref 0.7–4.0)
MCH: 28.6 pg (ref 26.0–34.0)
MCHC: 30.6 g/dL (ref 30.0–36.0)
MCV: 93.7 fL (ref 80.0–100.0)
Monocytes Absolute: 0.6 10*3/uL (ref 0.1–1.0)
Monocytes Relative: 7 %
Neutro Abs: 4.8 10*3/uL (ref 1.7–7.7)
Neutrophils Relative %: 57 %
Platelets: 185 10*3/uL (ref 150–400)
RBC: 4.12 MIL/uL (ref 3.87–5.11)
RDW: 16.5 % — ABNORMAL HIGH (ref 11.5–15.5)
WBC: 8.4 10*3/uL (ref 4.0–10.5)
nRBC: 0 % (ref 0.0–0.2)

## 2022-11-28 LAB — PROTEIN / CREATININE RATIO, URINE
Creatinine, Urine: 292 mg/dL
Protein Creatinine Ratio: 1.6 mg/mg{creat} — ABNORMAL HIGH (ref 0.00–0.15)
Total Protein, Urine: 468 mg/dL

## 2022-11-29 LAB — PARATHYROID HORMONE, INTACT (NO CA): PTH: 27 pg/mL (ref 15–65)

## 2022-12-02 DIAGNOSIS — D638 Anemia in other chronic diseases classified elsewhere: Secondary | ICD-10-CM | POA: Diagnosis not present

## 2022-12-02 DIAGNOSIS — D472 Monoclonal gammopathy: Secondary | ICD-10-CM | POA: Diagnosis not present

## 2022-12-02 DIAGNOSIS — E1122 Type 2 diabetes mellitus with diabetic chronic kidney disease: Secondary | ICD-10-CM | POA: Diagnosis not present

## 2022-12-02 DIAGNOSIS — R808 Other proteinuria: Secondary | ICD-10-CM | POA: Diagnosis not present

## 2022-12-06 DIAGNOSIS — R5381 Other malaise: Secondary | ICD-10-CM | POA: Diagnosis not present

## 2022-12-06 DIAGNOSIS — E1140 Type 2 diabetes mellitus with diabetic neuropathy, unspecified: Secondary | ICD-10-CM | POA: Diagnosis not present

## 2022-12-06 DIAGNOSIS — R55 Syncope and collapse: Secondary | ICD-10-CM | POA: Diagnosis not present

## 2022-12-06 DIAGNOSIS — I10 Essential (primary) hypertension: Secondary | ICD-10-CM | POA: Diagnosis not present

## 2022-12-06 DIAGNOSIS — M545 Low back pain, unspecified: Secondary | ICD-10-CM | POA: Diagnosis not present

## 2022-12-06 DIAGNOSIS — E538 Deficiency of other specified B group vitamins: Secondary | ICD-10-CM | POA: Diagnosis not present

## 2022-12-06 DIAGNOSIS — M109 Gout, unspecified: Secondary | ICD-10-CM | POA: Diagnosis not present

## 2022-12-06 DIAGNOSIS — E7849 Other hyperlipidemia: Secondary | ICD-10-CM | POA: Diagnosis not present

## 2022-12-06 DIAGNOSIS — E46 Unspecified protein-calorie malnutrition: Secondary | ICD-10-CM | POA: Diagnosis not present

## 2022-12-06 DIAGNOSIS — D472 Monoclonal gammopathy: Secondary | ICD-10-CM | POA: Diagnosis not present

## 2022-12-06 DIAGNOSIS — I4510 Unspecified right bundle-branch block: Secondary | ICD-10-CM | POA: Diagnosis not present

## 2022-12-06 DIAGNOSIS — N183 Chronic kidney disease, stage 3 unspecified: Secondary | ICD-10-CM | POA: Diagnosis not present

## 2023-01-06 DIAGNOSIS — Z0001 Encounter for general adult medical examination with abnormal findings: Secondary | ICD-10-CM | POA: Diagnosis not present

## 2023-01-06 DIAGNOSIS — I10 Essential (primary) hypertension: Secondary | ICD-10-CM | POA: Diagnosis not present

## 2023-01-06 DIAGNOSIS — E1165 Type 2 diabetes mellitus with hyperglycemia: Secondary | ICD-10-CM | POA: Diagnosis not present

## 2023-01-06 DIAGNOSIS — E538 Deficiency of other specified B group vitamins: Secondary | ICD-10-CM | POA: Diagnosis not present

## 2023-01-06 DIAGNOSIS — E7849 Other hyperlipidemia: Secondary | ICD-10-CM | POA: Diagnosis not present

## 2023-01-06 DIAGNOSIS — N183 Chronic kidney disease, stage 3 unspecified: Secondary | ICD-10-CM | POA: Diagnosis not present

## 2023-01-27 DIAGNOSIS — H35033 Hypertensive retinopathy, bilateral: Secondary | ICD-10-CM | POA: Diagnosis not present

## 2023-01-31 ENCOUNTER — Inpatient Hospital Stay: Payer: Medicare Other | Attending: Hematology

## 2023-01-31 ENCOUNTER — Ambulatory Visit (HOSPITAL_COMMUNITY)
Admission: RE | Admit: 2023-01-31 | Discharge: 2023-01-31 | Disposition: A | Discharge status: Home / Self Care | Payer: Medicare Other | Source: Ambulatory Visit | Attending: Physician Assistant | Admitting: Physician Assistant

## 2023-01-31 ENCOUNTER — Other Ambulatory Visit: Payer: Self-pay

## 2023-01-31 DIAGNOSIS — E1122 Type 2 diabetes mellitus with diabetic chronic kidney disease: Secondary | ICD-10-CM | POA: Diagnosis not present

## 2023-01-31 DIAGNOSIS — D472 Monoclonal gammopathy: Secondary | ICD-10-CM | POA: Diagnosis not present

## 2023-01-31 DIAGNOSIS — R339 Retention of urine, unspecified: Secondary | ICD-10-CM

## 2023-01-31 DIAGNOSIS — R768 Other specified abnormal immunological findings in serum: Secondary | ICD-10-CM | POA: Insufficient documentation

## 2023-01-31 DIAGNOSIS — R7402 Elevation of levels of lactic acid dehydrogenase (LDH): Secondary | ICD-10-CM | POA: Insufficient documentation

## 2023-01-31 DIAGNOSIS — N183 Chronic kidney disease, stage 3 unspecified: Secondary | ICD-10-CM | POA: Insufficient documentation

## 2023-01-31 DIAGNOSIS — M47819 Spondylosis without myelopathy or radiculopathy, site unspecified: Secondary | ICD-10-CM | POA: Diagnosis not present

## 2023-01-31 DIAGNOSIS — Z801 Family history of malignant neoplasm of trachea, bronchus and lung: Secondary | ICD-10-CM | POA: Diagnosis not present

## 2023-01-31 LAB — LACTATE DEHYDROGENASE: LDH: 166 U/L (ref 98–192)

## 2023-01-31 LAB — CBC WITH DIFFERENTIAL/PLATELET
Abs Immature Granulocytes: 0.02 10*3/uL (ref 0.00–0.07)
Basophils Absolute: 0.1 10*3/uL (ref 0.0–0.1)
Basophils Relative: 1 %
Eosinophils Absolute: 0.1 10*3/uL (ref 0.0–0.5)
Eosinophils Relative: 2 %
HCT: 39.1 % (ref 36.0–46.0)
Hemoglobin: 11.9 g/dL — ABNORMAL LOW (ref 12.0–15.0)
Immature Granulocytes: 0 %
Lymphocytes Relative: 25 %
Lymphs Abs: 2 10*3/uL (ref 0.7–4.0)
MCH: 28.7 pg (ref 26.0–34.0)
MCHC: 30.4 g/dL (ref 30.0–36.0)
MCV: 94.2 fL (ref 80.0–100.0)
Monocytes Absolute: 0.6 10*3/uL (ref 0.1–1.0)
Monocytes Relative: 7 %
Neutro Abs: 5.1 10*3/uL (ref 1.7–7.7)
Neutrophils Relative %: 65 %
Platelets: 196 10*3/uL (ref 150–400)
RBC: 4.15 MIL/uL (ref 3.87–5.11)
RDW: 15.9 % — ABNORMAL HIGH (ref 11.5–15.5)
WBC: 7.9 10*3/uL (ref 4.0–10.5)
nRBC: 0 % (ref 0.0–0.2)

## 2023-01-31 LAB — COMPREHENSIVE METABOLIC PANEL
ALT: 20 U/L (ref 0–44)
AST: 29 U/L (ref 15–41)
Albumin: 3.7 g/dL (ref 3.5–5.0)
Alkaline Phosphatase: 60 U/L (ref 38–126)
Anion gap: 11 (ref 5–15)
BUN: 25 mg/dL — ABNORMAL HIGH (ref 8–23)
CO2: 24 mmol/L (ref 22–32)
Calcium: 9.5 mg/dL (ref 8.9–10.3)
Chloride: 104 mmol/L (ref 98–111)
Creatinine, Ser: 1.51 mg/dL — ABNORMAL HIGH (ref 0.44–1.00)
GFR, Estimated: 37 mL/min — ABNORMAL LOW (ref >60)
Glucose, Bld: 130 mg/dL — ABNORMAL HIGH (ref 70–99)
Potassium: 4.2 mmol/L (ref 3.5–5.1)
Sodium: 139 mmol/L (ref 135–145)
Total Bilirubin: 0.6 mg/dL (ref <1.2)
Total Protein: 7.3 g/dL (ref 6.5–8.1)

## 2023-02-03 LAB — KAPPA/LAMBDA LIGHT CHAINS
Kappa free light chain: 82.5 mg/L — ABNORMAL HIGH (ref 3.3–19.4)
Kappa, lambda light chain ratio: 1.3 (ref 0.26–1.65)
Lambda free light chains: 63.3 mg/L — ABNORMAL HIGH (ref 5.7–26.3)

## 2023-02-04 ENCOUNTER — Ambulatory Visit: Payer: Medicare Other | Admitting: Cardiology

## 2023-02-04 DIAGNOSIS — H35033 Hypertensive retinopathy, bilateral: Secondary | ICD-10-CM | POA: Diagnosis not present

## 2023-02-04 DIAGNOSIS — H2513 Age-related nuclear cataract, bilateral: Secondary | ICD-10-CM | POA: Diagnosis not present

## 2023-02-04 DIAGNOSIS — H43823 Vitreomacular adhesion, bilateral: Secondary | ICD-10-CM | POA: Diagnosis not present

## 2023-02-04 DIAGNOSIS — H35372 Puckering of macula, left eye: Secondary | ICD-10-CM | POA: Diagnosis not present

## 2023-02-04 DIAGNOSIS — E119 Type 2 diabetes mellitus without complications: Secondary | ICD-10-CM | POA: Diagnosis not present

## 2023-02-04 DIAGNOSIS — E113212 Type 2 diabetes mellitus with mild nonproliferative diabetic retinopathy with macular edema, left eye: Secondary | ICD-10-CM | POA: Diagnosis not present

## 2023-02-04 LAB — UPEP/UIFE/LIGHT CHAINS/TP, 24-HR UR
% BETA, Urine: 11.4 %
ALPHA 1 URINE: 3.2 %
Albumin, U: 73.6 %
Alpha 2, Urine: 4.2 %
Free Kappa Lt Chains,Ur: 106.45 mg/L — ABNORMAL HIGH (ref 1.17–86.46)
Free Kappa/Lambda Ratio: 4.07 (ref 1.83–14.26)
Free Lambda Lt Chains,Ur: 26.16 mg/L — ABNORMAL HIGH (ref 0.27–15.21)
GAMMA GLOBULIN URINE: 7.7 %
Total Protein, Urine-Ur/day: 2233 mg/(24.h) — ABNORMAL HIGH (ref 30–150)
Total Protein, Urine: 248.1 mg/dL
Total Volume: 900

## 2023-02-05 LAB — PROTEIN ELECTROPHORESIS, SERUM
A/G Ratio: 1.1 (ref 0.7–1.7)
Albumin ELP: 3.6 g/dL (ref 2.9–4.4)
Alpha-1-Globulin: 0.2 g/dL (ref 0.0–0.4)
Alpha-2-Globulin: 0.9 g/dL (ref 0.4–1.0)
Beta Globulin: 1.1 g/dL (ref 0.7–1.3)
Gamma Globulin: 1 g/dL (ref 0.4–1.8)
Globulin, Total: 3.3 g/dL (ref 2.2–3.9)
M-Spike, %: 0.4 g/dL — ABNORMAL HIGH
Total Protein ELP: 6.9 g/dL (ref 6.0–8.5)

## 2023-02-05 LAB — IMMUNOFIXATION ELECTROPHORESIS
IgA: 289 mg/dL (ref 64–422)
IgG (Immunoglobin G), Serum: 905 mg/dL (ref 586–1602)
IgM (Immunoglobulin M), Srm: 363 mg/dL — ABNORMAL HIGH (ref 26–217)
Total Protein ELP: 6.9 g/dL (ref 6.0–8.5)

## 2023-02-06 ENCOUNTER — Inpatient Hospital Stay: Payer: Medicare Other | Admitting: Oncology

## 2023-02-06 ENCOUNTER — Encounter: Payer: Self-pay | Admitting: Cardiology

## 2023-02-06 ENCOUNTER — Ambulatory Visit: Payer: Medicare Other | Attending: Cardiology | Admitting: Cardiology

## 2023-02-06 VITALS — BP 178/82 | HR 70 | Temp 97.3°F | Resp 18 | Wt 162.0 lb

## 2023-02-06 VITALS — BP 128/64 | HR 75 | Ht 62.0 in | Wt 163.4 lb

## 2023-02-06 DIAGNOSIS — D472 Monoclonal gammopathy: Secondary | ICD-10-CM | POA: Diagnosis not present

## 2023-02-06 DIAGNOSIS — R768 Other specified abnormal immunological findings in serum: Secondary | ICD-10-CM | POA: Diagnosis not present

## 2023-02-06 DIAGNOSIS — Z801 Family history of malignant neoplasm of trachea, bronchus and lung: Secondary | ICD-10-CM | POA: Diagnosis not present

## 2023-02-06 DIAGNOSIS — I10 Essential (primary) hypertension: Secondary | ICD-10-CM

## 2023-02-06 DIAGNOSIS — N189 Chronic kidney disease, unspecified: Secondary | ICD-10-CM | POA: Diagnosis not present

## 2023-02-06 DIAGNOSIS — N183 Chronic kidney disease, stage 3 unspecified: Secondary | ICD-10-CM | POA: Diagnosis not present

## 2023-02-06 DIAGNOSIS — R55 Syncope and collapse: Secondary | ICD-10-CM | POA: Diagnosis not present

## 2023-02-06 DIAGNOSIS — R7402 Elevation of levels of lactic acid dehydrogenase (LDH): Secondary | ICD-10-CM | POA: Diagnosis not present

## 2023-02-06 DIAGNOSIS — E538 Deficiency of other specified B group vitamins: Secondary | ICD-10-CM | POA: Diagnosis not present

## 2023-02-06 DIAGNOSIS — E1122 Type 2 diabetes mellitus with diabetic chronic kidney disease: Secondary | ICD-10-CM | POA: Diagnosis not present

## 2023-02-06 MED ORDER — HYDRALAZINE HCL 25 MG PO TABS: 25.0000 mg | ORAL_TABLET | Freq: Two times a day (BID) | ORAL | 6 refills | Status: DC

## 2023-02-06 NOTE — Progress Notes (Signed)
[facility] [address], [place] [address]   CLINIC:  Medical Oncology/Hematology  PCP:  Sheela Stack [address] [place] [address] [phone]   REASON FOR VISIT:  Follow-up for abnormal SPEP   PRIOR THERAPY: None   CURRENT THERAPY: Surveillance   INTERVAL HISTORY:   Angelica Conway 72 y.o. female returns for routine follow-up of her abnormal SPEP.  She was last seen by Rojelio Brenner PA-C on 08/01/2022.  Since her last visit, she denies any hospitalizations, surgeries or changes in baseline health.  Reports appetite at 100% energy levels of 80%.  Denies any pain.  She was seen by cardiology on 08/06/2022 for bradycardia, hypertension, hyponatremia and syncope.  Previously having several episodes of syncope and presyncopal episodes while using the restroom.  He recommended a 30-day Holter monitor.  She denies any additional syncopal episodes since her last visit.  Reports she has increased her water intake at the request of Dr. Wolfgang Phoenix which has made all the difference in how she feels.  Has history of urinary incontinence and urinary retention and is followed by urology.  Reports she fell over a year ago and was using a wheelchair up until recently.  Has started to use her walker and get her strength back.  She denies any new onset bone pain or fractures. She denies any B symptoms such as fever, chills, night sweats, unintentional weight loss.    Reports she had a kidney biopsy back in April by Dr. 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  Unable to view results in epic.  She follows closely with Dr. Wolfgang Phoenix and sees them back in January.  ASSESSMENT   1.  IgM lambda monoclonal gammopathy: - Patient seen at the request of Dr. Wolfgang Phoenix, who noted the following lab abnormalities: Immunofixation (07/25/2021): Faint IgM lambda monoclonal protein detected Free kappa light chains 47.5, free lambda light chains  48.1, ratio 0.99 SPEP (07/25/2021) poorly defined band of restricted protein mobility in the gammaglobulin - Hematology work-up (01/11/2022): Immunofixation shows polyclonal increase in 1 or more immunoglobulins SPEP negative for M spike Elevated kappa light chains 89.3, elevated lambda 55.2.  Normal ratio 1.62, in keeping with her underlying CKD. Mildly elevated beta-2 microglobulin 4.5, normal LDH. CMP (01/11/2022): Baseline creatinine 1.22/GFR 47 in keeping with previously known CKD stage IIIa/b secondary to diabetes.  Normal calcium 9.8.  Hemoglobin normal at 12.0. - Skeletal survey (01/11/2022): No suspicious lytic or sclerotic lesions noted.   2.  Stage III CKD: - CKD since 2022, thought to be secondary to diabetes mellitus. - Nonnephrotic range proteinuria, 24-hour protein 1.3 g - Renal ultrasound (08/03/2021): Increased renal parenchymal echogenicity typical of chronic medical renal disease. - She had kidney biopsy on 07/01/2022, results pending.   3.  Social/family history: - Personal history of cervical cancer in her 57s, treated with D&C and conization - OTHER PMH: Anxiety/depression, type 2 diabetes mellitus, hypertension, hyperlipidemia, CKD, arthritis - Lives at home with son and daughter-in-law.  Mostly sits in chair in the bed.  Movement is limited by dizziness and right leg problem.  Walks with help of walker.  Worked as a Automotive engineer prior to retirement.  Non-smoker. - No family history of multiple myeloma.  2 sisters had cervical cancer and 1 sister had lung cancer.  PLAN: 1. MGUS (monoclonal gammopathy of unknown significance) - She denies any new bone pain or neurologic changes.  No B symptoms.  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 - Most recent lab work from 01/31/2023 shows a hemoglobin of 11.9/hematocrit 39.1 with essentially unremarkable differential.  Protein electrophoresis shows M spike 0.4 (0.3).   Immunofixation shows elevated IgM 363 indicating IgM monoclonal protein light chain specificity.  Kappa lambda light chains are elevated within normal light chain ratio.  LDH is normal.  CMP shows creatinine 1.51 (1.58) and GFR 37. - Patient continues to display evidence of MGUS, without meeting criteria for multiple myeloma or necessitating bone marrow biopsy at this time. -24-hour urine UPEP from 01/31/2023 showed significant amount of protein greater than 2000 but no evidence of M spike or Bence-Jones protein.   - Patient does not meet crab criteria.  Bone skeletal survey is pending during dictation.  Will call with results. -Recommend follow-up in 6 months with labs a few days before.  Recommend bone marrow biopsy if she develops crab criteria or significant M spike greater than 1.0 g.  2. Chronic kidney disease, unspecified CKD stage -She is followed by Dr. Wolfgang Phoenix. -Reports she had a kidney biopsy back in April which revealed kidney fibroids. -We discussed recent UPEP showed elevation in her protein greater than 2000.  We will fax these results to Dr. Wolfgang Phoenix so he is aware.   PLAN SUMMARY: >> Labs in 6 months (CBC/D, CMP, LDH, SPEP, immunofixation, light chains) >> Office visit in 6 months, 1 week after labs     REVIEW OF SYSTEMS:   Review of Systems  Respiratory:  Positive for cough.   Cardiovascular:  Positive for palpitations.     PHYSICAL EXAM:  ECOG PERFORMANCE STATUS: 2 - Symptomatic, <50% confined to bed  Vitals:   02/06/23 0949  BP: (!) 178/82  Pulse: 70  Resp: 18  Temp: (!) 97.3 F (36.3 C)  SpO2: 100%   Filed Weights   02/06/23 0949  Weight: 162 lb 0.6 oz (73.5 kg)   Physical Exam Constitutional:      Appearance: Normal appearance.  Cardiovascular:     Rate and Rhythm: Normal rate and regular rhythm.  Pulmonary:     Effort: Pulmonary effort is normal.     Breath sounds: Normal breath sounds.  Abdominal:     General: Bowel sounds are normal.      Palpations: Abdomen is soft.  Musculoskeletal:        General: No swelling. Normal range of motion.  Neurological:     Mental Status: She is alert and oriented to person, place, and time. Mental status is at baseline.     PAST MEDICAL/SURGICAL HISTORY:  Past Medical History:  Diagnosis Date   Abnormal EKG    Anxiety    Arthritis    Chronic kidney disease    stage 3   Complication of anesthesia    difficulty waking up   Diabetes mellitus without complication (HCC)    Gout    H/O degenerative disc disease    Hyperlipidemia    Hypertension    Past Surgical History:  Procedure Laterality Date   APPENDECTOMY  1985   cholecystectomy  1985   COLONOSCOPY     COLONOSCOPY WITH PROPOFOL N/A 10/29/2021   Procedure: COLONOSCOPY WITH PROPOFOL;  Surgeon: Lanelle Bal, DO;  [facility];  Service: Endoscopy;  Laterality: N/A;  11:00AM, ASA 3   POLYPECTOMY  10/29/2021   Procedure: POLYPECTOMY;  Surgeon: Lanelle Bal, DO;  Location: [facility];  Service: Endoscopy;;  TONSILLECTOMY  1957    SOCIAL HISTORY:  Social History   Socioeconomic History   Marital status: Widowed    Spouse name: Not on file   Number of children: 4   Years of education: Not on file   Highest education level: Not on file  Occupational History   Not on file  Tobacco Use   Smoking status: Never   Smokeless tobacco: Never  Vaping Use   Vaping status: Never Used  Substance and Sexual Activity   Alcohol use: No   Drug use: Not Currently   Sexual activity: Not Currently  Other Topics Concern   Not on file  Social History Narrative   Not on file   Social Determinants of Health   Financial Resource Strain: Medium Risk (07/04/2021)   Received from [facility], [facility]   Overall Financial Resource Strain (CARDIA)    Difficulty of Paying Living Expenses: Somewhat hard  Food Insecurity: No Food Insecurity (07/04/2021)   Received from [facility], [facility]   Hunger Vital  Sign    Worried About Running Out of Food in the Last Year: Never true    Ran Out of Food in the Last Year: Never true  Transportation Needs: No Transportation Needs (07/04/2021)   Received from [facility], [facility]   PRAPARE - Transportation    Lack of Transportation (Medical): No    Lack of Transportation (Non-Medical): No  Physical Activity: Inactive (07/04/2021)   Received from [facility], [facility]   Exercise Vital Sign    Days of Exercise per Week: 0 days    Minutes of Exercise per Session: 0 min  Stress: Stress Concern Present (07/04/2021)   Received from [facility], [facility] - Occupational Stress Questionnaire    Feeling of Stress : To some extent  Social Connections: Unknown (07/29/2022)   Received from [facility], [facility]   Social Network    Social Network: Not on file  Intimate Partner Violence: Unknown (07/29/2022)   Received from [facility], [facility]   HITS    Physically Hurt: Not on file    Insult or Talk Down To: Not on file    Threaten Physical Harm: Not on file    Scream or Curse: Not on file    FAMILY HISTORY:  Family History  Problem Relation Age of Onset   Lung cancer Mother    Heart attack Father    Heart disease Father    Diabetes Sister     CURRENT MEDICATIONS:  Outpatient Encounter Medications as of 02/06/2023  Medication Sig   acetaminophen (TYLENOL) 325 MG tablet Take 650 mg by mouth every 6 (six) hours as needed for moderate pain.   atorvastatin (LIPITOR) 80 MG tablet Take 80 mg by mouth daily.   carvedilol (COREG) 12.5 MG tablet TAKE 1 TABLET BY MOUTH TWICE DAILY WITH A MEAL   cholecalciferol (VITAMIN D3) 25 MCG (1000 UNIT) tablet Take 1,000 Units by mouth daily.   cyanocobalamin (,VITAMIN B-12,) 1000 MCG/ML injection Inject 1,000 mcg into the muscle every Friday.   empagliflozin (JARDIANCE) 10 MG TABS tablet Take 10 mg by mouth daily.   escitalopram (LEXAPRO) 5 MG  tablet Take 5 mg by mouth daily.   lisinopril (ZESTRIL) 20 MG tablet TAKE 1 TABLET BY MOUTH DAILY   loratadine (CLARITIN) 10 MG tablet Take 10 mg by mouth daily as needed.   magnesium oxide (MAG-OX) 400 (240 Mg) MG tablet Take 400 mg  by mouth 2 (two) times daily.   polyethylene glycol (MIRALAX / GLYCOLAX) 17 g packet Take 17 g by mouth daily as needed for moderate constipation.   allopurinol (ZYLOPRIM) 100 MG tablet Take 100 mg by mouth daily.   metFORMIN (GLUCOPHAGE) 1000 MG tablet Take 1,000 mg by mouth daily with breakfast.   omeprazole (PRILOSEC) 20 MG capsule Take 20 mg by mouth daily.    No facility-administered encounter medications on file as of 02/06/2023.    ALLERGIES:  Allergies  Allergen Reactions   Amlodipine     Unknown reaction   Codeine     Unknown reaction   Influenza Vaccines Swelling    Local reaction at site of injection  Can tolerate egg free version    LABORATORY DATA:  I have reviewed the labs as listed.  CBC    Component Value Date/Time   WBC 7.9 01/31/2023 1043   RBC 4.15 01/31/2023 1043   HGB 11.9 (L) 01/31/2023 1043   HCT 39.1 01/31/2023 1043   PLT 196 01/31/2023 1043   MCV 94.2 01/31/2023 1043   MCH 28.7 01/31/2023 1043   MCHC 30.4 01/31/2023 1043   RDW 15.9 (H) 01/31/2023 1043   LYMPHSABS 2.0 01/31/2023 1043   MONOABS 0.6 01/31/2023 1043   EOSABS 0.1 01/31/2023 1043   BASOSABS 0.1 01/31/2023 1043      Latest Ref Rng & Units 01/31/2023   10:43 AM 11/28/2022    1:24 PM 07/25/2022   11:01 AM  CMP  Glucose 70 - 99 mg/dL 161  096  045   BUN 8 - 23 mg/dL 25  28  24    Creatinine 0.44 - 1.00 mg/dL 4.09  8.11  9.14   Sodium 135 - 145 mmol/L 139  139  139   Potassium 3.5 - 5.1 mmol/L 4.2  4.4  4.5   Chloride 98 - 111 mmol/L 104  105  104   CO2 22 - 32 mmol/L 24  23  25    Calcium 8.9 - 10.3 mg/dL 9.5  9.4  9.6   Total Protein 6.5 - 8.1 g/dL 7.3     Total Bilirubin <1.2 mg/dL 0.6     Alkaline Phos 38 - 126 U/L 60     AST 15 - 41 U/L 29      ALT 0 - 44 U/L 20       DIAGNOSTIC IMAGING:  I have independently reviewed the relevant imaging and discussed with the patient.   WRAP UP:  All questions were answered. The patient knows to call the clinic with any problems, questions or concerns.  Medical decision making: Moderate  Time spent on visit: I spent 20 minutes dedicated to the care of this patient (face-to-face and non-face-to-face) on the date of the encounter to include what is described in the assessment and plan. d more than 50% was on counseling.  Mauro Kaufmann, NP  02/06/23 10:00 AM

## 2023-02-06 NOTE — Progress Notes (Signed)
 Clinical Summary Ms. Thorington is a 72 y.o.female seen today for follow up of the following medical problems.    1. Bradycardia -ER visit [facility] 12/26/2020 with low HRs - was on cardizem and metoprolol at the time. Cardizem had recently been increased to 240mg  daily due to high bp's - from ER notes HRs in 30s, given atropine and calcium - HR imporved off meds   - 12/2021 monitor: some SVT, longest 15.6 seconds.   08/2022 monitor 30 days: symptoms correlated with sinus rhythm. Min HR 55, Max HR 98, avg HR 71  - resolved off high doses av nodal agents, has done fine on coreg 12.5mg  bid     2. HTN - dilt and lopressor stopped after ER visit - was to start hydralazine  25mg  tid,a s well as lisinopril /HCTZ 40/25 - listed allergy to norvasc not specified. Leg edema per her report.   -02/2021 normal renin/aldo levels   - neprhology started coreg 07/21/21   - compliant with meds - followed by nephrology - no recent syncope. Some swimmy headedness - thinks had some swelling in legs on norvasc.      3. 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 Syncope - seen 12/2021 by NP Otho Blitz in clinic - at that time reported 3 episodes of syncope over 1.5 months - thought to be vasovagal syncope - 12/2021 echo: LVEF 60-65%, grade I dd,  -12/2021 heart monitor: 14 day monitor, 8 runs SVT longest 15.6 sec.  - does restrict her fluid intake   - recurrent syncope     - 4-5 episodes over the last 3-4 months - most recent episode 2 weeks ago. Was sitting on commode, felt like she needed to have a BM. Was not straining. Can feeling of pressure shoulders and neck, felt like she was getting ready to pass out. +LOC. Family came in and checked on her, had BM at the time. Was unresponsive for few minutes, came too and groggy   - prior episode in Sept. Occurred on commode, felt need to BM. Was not straining. Constipated, hard time passing  BM. Pressure feeling, like getting ready to go out.    -episode when came out of bathroom, got up and open door and had episode of syncope.  - limiting fluid 1200 mL -has seen GI about constipation - she reports normal orthostatics     - last syncopal episode in February - she was sitting on her bed playing cards.  - felt like needed to have BM. Got up walked to the bathroom with walker - sat on commode and was using it, next thing she knew she passed out.  - since that time neprhology increased her fluid intake. Increased to 48 ounces.          - since then some episodes of dizzines, presyncope.      Past Medical History:  Diagnosis Date   Abnormal EKG    Anxiety    Arthritis    Chronic kidney disease    stage 3   Complication of anesthesia    difficulty waking up   Diabetes mellitus without complication (HCC)    Gout    H/O degenerative disc disease    Hyperlipidemia    Hypertension      Allergies  Allergen Reactions   Amlodipine     Unknown reaction   Codeine     Unknown reaction  Influenza Vaccines Swelling    Local reaction at site of injection  Can tolerate egg free version     Current Outpatient Medications  Medication Sig Dispense Refill   acetaminophen  (TYLENOL ) 325 MG tablet Take 650 mg by mouth every 6 (six) hours as needed for moderate pain.     allopurinol (ZYLOPRIM) 100 MG tablet Take 100 mg by mouth daily.     atorvastatin (LIPITOR) 80 MG tablet Take 80 mg by mouth daily.     carvedilol (COREG) 12.5 MG tablet TAKE 1 TABLET BY MOUTH TWICE DAILY WITH A MEAL 180 tablet 3   cholecalciferol (VITAMIN D3) 25 MCG (1000 UNIT) tablet Take 1,000 Units by mouth daily.     cyanocobalamin  (,VITAMIN B-12,) 1000 MCG/ML injection Inject 1,000 mcg into the muscle every Friday.     empagliflozin (JARDIANCE) 10 MG TABS tablet Take 10 mg by mouth daily.     escitalopram (LEXAPRO) 5 MG tablet Take 5 mg by mouth daily.     lisinopril  (ZESTRIL ) 20 MG tablet TAKE 1  TABLET BY MOUTH DAILY 30 tablet 6   loratadine (CLARITIN) 10 MG tablet Take 10 mg by mouth daily as needed.     magnesium  oxide (MAG-OX) 400 (240 Mg) MG tablet Take 400 mg by mouth 2 (two) times daily.     metFORMIN (GLUCOPHAGE) 1000 MG tablet Take 1,000 mg by mouth daily with breakfast.     omeprazole (PRILOSEC) 20 MG capsule Take 20 mg by mouth daily.      polyethylene glycol (MIRALAX / GLYCOLAX) 17 g packet Take 17 g by mouth daily as needed for moderate constipation.     No current facility-administered medications for this visit.     Past Surgical History:  Procedure Laterality Date   APPENDECTOMY  1985   cholecystectomy  1985   COLONOSCOPY     COLONOSCOPY WITH PROPOFOL  N/A 10/29/2021   Procedure: COLONOSCOPY WITH PROPOFOL ;  Surgeon: Vinetta Greening, DO;  [facility];  Service: Endoscopy;  Laterality: N/A;  11:00AM, ASA 3   POLYPECTOMY  10/29/2021   Procedure: POLYPECTOMY;  Surgeon: Vinetta Greening, DO;  [facility];  Service: Endoscopy;;   TONSILLECTOMY  1957     Allergies  Allergen Reactions   Amlodipine     Unknown reaction   Codeine     Unknown reaction   Influenza Vaccines Swelling    Local reaction at site of injection  Can tolerate egg free version      Family History  Problem Relation Age of Onset   Lung cancer Mother    Heart attack Father    Heart disease Father    Diabetes Sister      Social History Ms. Lauritsen reports that she has never smoked. She has never used smokeless tobacco. Ms. Teagarden reports no history of alcohol  use.     Physical Examination Today's Vitals   02/06/23 1257  BP: 128/64  Pulse: 75  SpO2: 99%  Weight: 163 lb 6.4 oz (74.1 kg)  Height: 5' 2 (1.575 m)   Body mass index is 29.89 kg/m.  Gen: resting comfortably, no acute distress HEENT: no scleral icterus, pupils equal round and reactive, no palptable cervical adenopathy,  CV: RRR, no m/r,g no jvd Resp: Clear to auscultation  bilaterally GI: abdomen is soft, non-tender, non-distended, normal bowel sounds, no hepatosplenomegaly MSK: extremities are warm, no edema.  Skin: warm, no rash Neuro:  no focal deficits Psych: appropriate affect   Diagnostic Studies  Assessment and Plan   1.Syncope - most likely vasovagal, continue to monitor at this time   2. HTN - above goal, start hydarlazine 25mg  bid     Laurann Pollock, M.D.

## 2023-02-06 NOTE — Patient Instructions (Addendum)
Medication Instructions:   Begin Hydralazine 25mg  twice a day   Continue all other medications.     Labwork:  none  Testing/Procedures:  none  Follow-Up:  6 months   Any Other Special Instructions Will Be Listed Below (If Applicable).  Please update the office with your BP readings 2 weeks after starting the Hydralazine.    If you need a refill on your cardiac medications before your next appointment, please call your pharmacy.

## 2023-02-20 ENCOUNTER — Telehealth: Payer: Self-pay | Admitting: Physician Assistant

## 2023-02-20 DIAGNOSIS — R936 Abnormal findings on diagnostic imaging of limbs: Secondary | ICD-10-CM

## 2023-02-20 NOTE — Telephone Encounter (Signed)
Discussed results of recent skeletal survey (01/31/2023, resulted on 02/19/2023) with patient Angelica Conway via phone call. Patient was informed of finding of smooth periosteal reaction involving the mid and proximal LEFT humeral shaft, which was not seen on prior exam.  We discussed that this was a nonspecific finding, but that further testing (MRI) was recommended for further characterization of this lesion.  MRI left humerus has been ordered.  Further plan TBD based on results.  Rojelio Brenner PA-C 02/20/2023 12:15 PM

## 2023-02-25 DIAGNOSIS — R808 Other proteinuria: Secondary | ICD-10-CM | POA: Diagnosis not present

## 2023-02-25 DIAGNOSIS — D472 Monoclonal gammopathy: Secondary | ICD-10-CM | POA: Diagnosis not present

## 2023-02-25 DIAGNOSIS — N189 Chronic kidney disease, unspecified: Secondary | ICD-10-CM | POA: Diagnosis not present

## 2023-02-25 DIAGNOSIS — E1122 Type 2 diabetes mellitus with diabetic chronic kidney disease: Secondary | ICD-10-CM | POA: Diagnosis not present

## 2023-02-25 DIAGNOSIS — D638 Anemia in other chronic diseases classified elsewhere: Secondary | ICD-10-CM | POA: Diagnosis not present

## 2023-02-27 ENCOUNTER — Ambulatory Visit (HOSPITAL_COMMUNITY)
Admission: RE | Admit: 2023-02-27 | Discharge: 2023-02-27 | Disposition: A | Discharge status: Home / Self Care | Payer: Medicare Other | Source: Ambulatory Visit | Attending: Physician Assistant | Admitting: Physician Assistant

## 2023-02-27 DIAGNOSIS — R936 Abnormal findings on diagnostic imaging of limbs: Secondary | ICD-10-CM | POA: Diagnosis not present

## 2023-02-27 DIAGNOSIS — M19012 Primary osteoarthritis, left shoulder: Secondary | ICD-10-CM | POA: Diagnosis not present

## 2023-02-28 DIAGNOSIS — D638 Anemia in other chronic diseases classified elsewhere: Secondary | ICD-10-CM | POA: Diagnosis not present

## 2023-02-28 DIAGNOSIS — R808 Other proteinuria: Secondary | ICD-10-CM | POA: Diagnosis not present

## 2023-02-28 DIAGNOSIS — E1122 Type 2 diabetes mellitus with diabetic chronic kidney disease: Secondary | ICD-10-CM | POA: Diagnosis not present

## 2023-02-28 DIAGNOSIS — D472 Monoclonal gammopathy: Secondary | ICD-10-CM | POA: Diagnosis not present

## 2023-03-05 DIAGNOSIS — I10 Essential (primary) hypertension: Secondary | ICD-10-CM | POA: Diagnosis not present

## 2023-03-05 DIAGNOSIS — E538 Deficiency of other specified B group vitamins: Secondary | ICD-10-CM | POA: Diagnosis not present

## 2023-03-05 DIAGNOSIS — N183 Chronic kidney disease, stage 3 unspecified: Secondary | ICD-10-CM | POA: Diagnosis not present

## 2023-03-05 DIAGNOSIS — I4510 Unspecified right bundle-branch block: Secondary | ICD-10-CM | POA: Diagnosis not present

## 2023-03-05 DIAGNOSIS — E46 Unspecified protein-calorie malnutrition: Secondary | ICD-10-CM | POA: Diagnosis not present

## 2023-03-05 DIAGNOSIS — M109 Gout, unspecified: Secondary | ICD-10-CM | POA: Diagnosis not present

## 2023-03-05 DIAGNOSIS — E7849 Other hyperlipidemia: Secondary | ICD-10-CM | POA: Diagnosis not present

## 2023-03-05 DIAGNOSIS — R55 Syncope and collapse: Secondary | ICD-10-CM | POA: Diagnosis not present

## 2023-03-05 DIAGNOSIS — D472 Monoclonal gammopathy: Secondary | ICD-10-CM | POA: Diagnosis not present

## 2023-03-05 DIAGNOSIS — E1140 Type 2 diabetes mellitus with diabetic neuropathy, unspecified: Secondary | ICD-10-CM | POA: Diagnosis not present

## 2023-03-05 DIAGNOSIS — R5381 Other malaise: Secondary | ICD-10-CM | POA: Diagnosis not present

## 2023-04-10 DIAGNOSIS — E538 Deficiency of other specified B group vitamins: Secondary | ICD-10-CM | POA: Diagnosis not present

## 2023-05-13 DIAGNOSIS — E538 Deficiency of other specified B group vitamins: Secondary | ICD-10-CM | POA: Diagnosis not present

## 2023-05-29 DIAGNOSIS — E211 Secondary hyperparathyroidism, not elsewhere classified: Secondary | ICD-10-CM | POA: Diagnosis not present

## 2023-05-29 DIAGNOSIS — D631 Anemia in chronic kidney disease: Secondary | ICD-10-CM | POA: Diagnosis not present

## 2023-05-29 DIAGNOSIS — R809 Proteinuria, unspecified: Secondary | ICD-10-CM | POA: Diagnosis not present

## 2023-05-29 DIAGNOSIS — N189 Chronic kidney disease, unspecified: Secondary | ICD-10-CM | POA: Diagnosis not present

## 2023-06-05 DIAGNOSIS — D638 Anemia in other chronic diseases classified elsewhere: Secondary | ICD-10-CM | POA: Diagnosis not present

## 2023-06-05 DIAGNOSIS — D472 Monoclonal gammopathy: Secondary | ICD-10-CM | POA: Diagnosis not present

## 2023-06-05 DIAGNOSIS — N1832 Chronic kidney disease, stage 3b: Secondary | ICD-10-CM | POA: Diagnosis not present

## 2023-06-05 DIAGNOSIS — I129 Hypertensive chronic kidney disease with stage 1 through stage 4 chronic kidney disease, or unspecified chronic kidney disease: Secondary | ICD-10-CM | POA: Diagnosis not present

## 2023-06-05 LAB — LAB REPORT - SCANNED: PTH, Intact: 40

## 2023-06-10 DIAGNOSIS — N183 Chronic kidney disease, stage 3 unspecified: Secondary | ICD-10-CM | POA: Diagnosis not present

## 2023-06-10 DIAGNOSIS — E785 Hyperlipidemia, unspecified: Secondary | ICD-10-CM | POA: Diagnosis not present

## 2023-06-10 DIAGNOSIS — I10 Essential (primary) hypertension: Secondary | ICD-10-CM | POA: Diagnosis not present

## 2023-06-10 DIAGNOSIS — E1165 Type 2 diabetes mellitus with hyperglycemia: Secondary | ICD-10-CM | POA: Diagnosis not present

## 2023-08-06 ENCOUNTER — Other Ambulatory Visit: Payer: Self-pay

## 2023-08-06 DIAGNOSIS — D472 Monoclonal gammopathy: Secondary | ICD-10-CM

## 2023-08-07 ENCOUNTER — Inpatient Hospital Stay: Payer: Medicare Other | Attending: Hematology

## 2023-08-07 DIAGNOSIS — R809 Proteinuria, unspecified: Secondary | ICD-10-CM | POA: Insufficient documentation

## 2023-08-07 DIAGNOSIS — Z8049 Family history of malignant neoplasm of other genital organs: Secondary | ICD-10-CM | POA: Insufficient documentation

## 2023-08-07 DIAGNOSIS — Z801 Family history of malignant neoplasm of trachea, bronchus and lung: Secondary | ICD-10-CM | POA: Diagnosis not present

## 2023-08-07 DIAGNOSIS — D472 Monoclonal gammopathy: Secondary | ICD-10-CM | POA: Diagnosis not present

## 2023-08-07 DIAGNOSIS — Z8541 Personal history of malignant neoplasm of cervix uteri: Secondary | ICD-10-CM | POA: Insufficient documentation

## 2023-08-07 DIAGNOSIS — N183 Chronic kidney disease, stage 3 unspecified: Secondary | ICD-10-CM | POA: Insufficient documentation

## 2023-08-07 LAB — COMPREHENSIVE METABOLIC PANEL WITH GFR
ALT: 20 U/L (ref 0–44)
AST: 30 U/L (ref 15–41)
Albumin: 3.5 g/dL (ref 3.5–5.0)
Alkaline Phosphatase: 90 U/L (ref 38–126)
Anion gap: 11 (ref 5–15)
BUN: 28 mg/dL — ABNORMAL HIGH (ref 8–23)
CO2: 21 mmol/L — ABNORMAL LOW (ref 22–32)
Calcium: 9.4 mg/dL (ref 8.9–10.3)
Chloride: 105 mmol/L (ref 98–111)
Creatinine, Ser: 2.01 mg/dL — ABNORMAL HIGH (ref 0.44–1.00)
GFR, Estimated: 26 mL/min — ABNORMAL LOW (ref >60)
Glucose, Bld: 141 mg/dL — ABNORMAL HIGH (ref 70–99)
Potassium: 4.4 mmol/L (ref 3.5–5.1)
Sodium: 137 mmol/L (ref 135–145)
Total Bilirubin: 0.7 mg/dL (ref 0.0–1.2)
Total Protein: 7.4 g/dL (ref 6.5–8.1)

## 2023-08-07 LAB — CBC WITH DIFFERENTIAL/PLATELET
Abs Immature Granulocytes: 0.02 10*3/uL (ref 0.00–0.07)
Basophils Absolute: 0.1 10*3/uL (ref 0.0–0.1)
Basophils Relative: 1 %
Eosinophils Absolute: 0.2 10*3/uL (ref 0.0–0.5)
Eosinophils Relative: 2 %
HCT: 37.2 % (ref 36.0–46.0)
Hemoglobin: 11.5 g/dL — ABNORMAL LOW (ref 12.0–15.0)
Immature Granulocytes: 0 %
Lymphocytes Relative: 20 %
Lymphs Abs: 1.7 10*3/uL (ref 0.7–4.0)
MCH: 28.5 pg (ref 26.0–34.0)
MCHC: 30.9 g/dL (ref 30.0–36.0)
MCV: 92.1 fL (ref 80.0–100.0)
Monocytes Absolute: 0.7 10*3/uL (ref 0.1–1.0)
Monocytes Relative: 8 %
Neutro Abs: 6.1 10*3/uL (ref 1.7–7.7)
Neutrophils Relative %: 69 %
Platelets: 184 10*3/uL (ref 150–400)
RBC: 4.04 MIL/uL (ref 3.87–5.11)
RDW: 17.1 % — ABNORMAL HIGH (ref 11.5–15.5)
WBC: 8.7 10*3/uL (ref 4.0–10.5)
nRBC: 0 % (ref 0.0–0.2)

## 2023-08-07 LAB — LACTATE DEHYDROGENASE: LDH: 164 U/L (ref 98–192)

## 2023-08-08 LAB — KAPPA/LAMBDA LIGHT CHAINS
Kappa free light chain: 87.1 mg/L — ABNORMAL HIGH (ref 3.3–19.4)
Kappa, lambda light chain ratio: 1.13 (ref 0.26–1.65)
Lambda free light chains: 76.8 mg/L — ABNORMAL HIGH (ref 5.7–26.3)

## 2023-08-10 LAB — IMMUNOFIXATION ELECTROPHORESIS
IgA: 280 mg/dL (ref 64–422)
IgG (Immunoglobin G), Serum: 878 mg/dL (ref 586–1602)
IgM (Immunoglobulin M), Srm: 362 mg/dL — ABNORMAL HIGH (ref 26–217)
Total Protein ELP: 6.8 g/dL (ref 6.0–8.5)

## 2023-08-11 ENCOUNTER — Other Ambulatory Visit: Payer: Self-pay | Admitting: Cardiology

## 2023-08-12 ENCOUNTER — Encounter: Payer: Self-pay | Admitting: Cardiology

## 2023-08-12 ENCOUNTER — Ambulatory Visit: Payer: Medicare Other | Attending: Cardiology | Admitting: Cardiology

## 2023-08-12 VITALS — BP 150/85 | HR 79 | Ht 62.0 in | Wt 172.2 lb

## 2023-08-12 DIAGNOSIS — I10 Essential (primary) hypertension: Secondary | ICD-10-CM

## 2023-08-12 DIAGNOSIS — R55 Syncope and collapse: Secondary | ICD-10-CM

## 2023-08-12 LAB — PROTEIN ELECTROPHORESIS, SERUM
A/G Ratio: 1.1 (ref 0.7–1.7)
Albumin ELP: 3.6 g/dL (ref 2.9–4.4)
Alpha-1-Globulin: 0.3 g/dL (ref 0.0–0.4)
Alpha-2-Globulin: 1 g/dL (ref 0.4–1.0)
Beta Globulin: 1.1 g/dL (ref 0.7–1.3)
Gamma Globulin: 1 g/dL (ref 0.4–1.8)
Globulin, Total: 3.4 g/dL (ref 2.2–3.9)
Total Protein ELP: 7 g/dL (ref 6.0–8.5)

## 2023-08-12 NOTE — Progress Notes (Signed)
 Clinical Summary Angelica Conway is a 73 y.o.female seen today for follow up of the following medical problems.    1. Bradycardia -ER visit [facility] 12/26/2020 with low HRs - was on cardizem and metoprolol at the time. Cardizem had recently been increased to 240mg  daily due to high bp's - from ER notes HRs in 30s, given atropine and calcium - HR imporved off meds   - 12/2021 monitor: some SVT, longest 15.6 seconds.   08/2022 monitor 30 days: symptoms correlated with sinus rhythm. Min HR 55, Max HR 98, avg HR 71   - resolved off high doses av nodal agents     2. HTN - dilt and lopressor stopped after ER visit - was to start hydralazine  25mg  tid,a s well as lisinopril /HCTZ 40/25 - listed allergy to norvasc not specified. Leg edema per her report.   -02/2021 normal renin/aldo levels   - neprhology started coreg 07/21/21  - thinks had some swelling in legs on norvasc.  - nephrology increased lisinopril  to 30mg  daily, coreg to 25mg  bid.  -medical therapy has been complicated by vasovagal presyncope/syncope episodes.      3. 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 Syncope - seen 12/2021 by NP Otho Blitz in clinic - at that time reported 3 episodes of syncope over 1.5 months - thought to be vasovagal syncope - 12/2021 echo: LVEF 60-65%, grade I dd,  -12/2021 heart monitor: 14 day monitor, 8 runs SVT longest 15.6 sec.  - does restrict her fluid intake   - 4-5 episodes over the last 3-4 months - most recent episode 2 weeks ago. Was sitting on commode, felt like she needed to have a BM. Was not straining. Can feeling of pressure shoulders and neck, felt like she was getting ready to pass out. +LOC. Family came in and checked on her, had BM at the time. Was unresponsive for few minutes, came too and groggy   - prior episode in Sept. Occurred on commode, felt need to BM. Was not straining. Constipated, hard time passing BM.  Pressure feeling, like getting ready to go out.    -episode when came out of bathroom, got up and open door and had episode of syncope.  - limiting fluid 1200 mL -has seen GI about constipation - she reports normal orthostatics     - last syncopal episode in February - she was sitting on her bed playing cards.  - felt like needed to have BM. Got up walked to the bathroom with walker - sat on commode and was using it, next thing she knew she passed out.  - since that time neprhology increased her fluid intake. Increased to 48 ounces.     -this afternoon some dizziness after episode diarrhea.     Past Medical History:  Diagnosis Date   Abnormal EKG    Anxiety    Arthritis    Chronic kidney disease    stage 3   Complication of anesthesia    difficulty waking up   Diabetes mellitus without complication (HCC)    Gout    H/O degenerative disc disease    Hyperlipidemia    Hypertension      Allergies  Allergen Reactions   Amlodipine     Unknown reaction   Codeine     Unknown reaction   Influenza Vaccines Swelling    Local reaction at site of injection  Can tolerate egg free version     Current Outpatient Medications  Medication Sig Dispense Refill   acetaminophen  (TYLENOL ) 325 MG tablet Take 650 mg by mouth every 6 (six) hours as needed for moderate pain.     allopurinol (ZYLOPRIM) 100 MG tablet Take 100 mg by mouth daily.     atorvastatin (LIPITOR) 80 MG tablet Take 80 mg by mouth daily.     carvedilol (COREG) 25 MG tablet Take 25 mg by mouth 2 (two) times daily.     cholecalciferol (VITAMIN D3) 25 MCG (1000 UNIT) tablet Take 1,000 Units by mouth daily.     empagliflozin (JARDIANCE) 10 MG TABS tablet Take 12.5 mg by mouth daily.     escitalopram (LEXAPRO) 5 MG tablet Take 5 mg by mouth daily.     hydrALAZINE  (APRESOLINE ) 25 MG tablet TAKE 1 TABLET BY MOUTH TWICE DAILY 60 tablet 6   lisinopril  (ZESTRIL ) 30 MG tablet Take 30 mg by mouth daily.     magnesium  oxide  (MAG-OX) 400 (240 Mg) MG tablet Take 400 mg by mouth 2 (two) times daily.     metFORMIN (GLUCOPHAGE) 1000 MG tablet Take 1,000 mg by mouth daily with breakfast.     omeprazole (PRILOSEC) 20 MG capsule Take 20 mg by mouth daily.      carvedilol (COREG) 12.5 MG tablet TAKE 1 TABLET BY MOUTH TWICE DAILY WITH A MEAL (Patient not taking: Reported on 08/12/2023) 180 tablet 3   cyanocobalamin  (,VITAMIN B-12,) 1000 MCG/ML injection Inject 1,000 mcg into the muscle every Friday. (Patient not taking: Reported on 08/12/2023)     lisinopril  (ZESTRIL ) 20 MG tablet TAKE 1 TABLET BY MOUTH DAILY (Patient not taking: Reported on 08/12/2023) 30 tablet 6   No current facility-administered medications for this visit.     Past Surgical History:  Procedure Laterality Date   APPENDECTOMY  1985   cholecystectomy  1985   COLONOSCOPY     COLONOSCOPY WITH PROPOFOL  N/A 10/29/2021   Procedure: COLONOSCOPY WITH PROPOFOL ;  Surgeon: Vinetta Greening, DO;  [facility];  Service: Endoscopy;  Laterality: N/A;  11:00AM, ASA 3   POLYPECTOMY  10/29/2021   Procedure: POLYPECTOMY;  Surgeon: Vinetta Greening, DO;  Location: [facility];  Service: Endoscopy;;   TONSILLECTOMY  1957     Allergies  Allergen Reactions   Amlodipine     Unknown reaction   Codeine     Unknown reaction   Influenza Vaccines Swelling    Local reaction at site of injection  Can tolerate egg free version      Family History  Problem Relation Age of Onset   Lung cancer Mother    Heart attack Father    Heart disease Father    Diabetes Sister      Social History Ms. Barcomb reports that she has never smoked. She has never used smokeless tobacco. Ms. Arps reports no history of alcohol  use.     Physical Examination Today's Vitals   08/12/23 1601 08/12/23 1651  BP: (!) 168/82 (!) 150/85  Pulse: 79   SpO2: 98%   Weight: 172 lb 3.2 oz (78.1 kg)   Height: 5\' 2"  (1.575 m)    Body mass index is 31.5 kg/m.   Gen: resting  comfortably, no acute distress HEENT: no scleral icterus, pupils equal round and reactive, no palptable cervical adenopathy,  CV: RRR, no mrg, no jvd Resp: Clear to auscultation bilaterally GI: abdomen is soft, non-tender, non-distended, normal bowel sounds, no hepatosplenomegaly  MSK: extremities are warm, no edema.  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 HTN - difficult management due to vasovagal syncope - recent increases in bp meds by nephrology - continue to monitor bp, in general accept higher bp given vasoval episodes.         Laurann Pollock, M.D.

## 2023-08-12 NOTE — Patient Instructions (Signed)
Medication Instructions:  Continue all current medications.  Labwork: none  Testing/Procedures: none  Follow-Up: 4 months   Any Other Special Instructions Will Be Listed Below (If Applicable).  If you need a refill on your cardiac medications before your next appointment, please call your pharmacy.\ 

## 2023-08-14 ENCOUNTER — Inpatient Hospital Stay: Payer: Medicare Other | Admitting: Oncology

## 2023-08-14 DIAGNOSIS — D472 Monoclonal gammopathy: Secondary | ICD-10-CM

## 2023-08-14 DIAGNOSIS — N183 Chronic kidney disease, stage 3 unspecified: Secondary | ICD-10-CM | POA: Diagnosis not present

## 2023-08-14 DIAGNOSIS — N1831 Chronic kidney disease, stage 3a: Secondary | ICD-10-CM

## 2023-08-14 NOTE — Progress Notes (Signed)
 Virtual Visit via Telephone Note  I connected with Angelica Conway on 08/14/23 at 10:00 AM EDT by telephone and verified that I am speaking with the correct person using two identifiers.  Location: Patient: Home Provider: Clinic    I discussed the limitations, risks, security and privacy concerns of performing an evaluation and management service by telephone and the availability of in person appointments. I also discussed with the patient that there may be a patient responsible charge related to this service. The patient expressed understanding and agreed to proceed.   History of Present Illness: Angelica Conway is a 73 year old female with past history significant for MGUS.  She was last seen in clinic on 02/06/2023 by me.  Since her last visit, she was seen by cardiology on 08/12/2023 for syncope.  Has history of bradycardia, hypertension CKD stage III, hyponatremia and syncope.  Falls felt to be secondary to vasovagal response with bowel movements.  She also has fluid restrictions due to SIADH and it was recommended she use compression stockings.  She had a skeletal survey on 01/31/2023 which showed periarterial reaction involving the mid and proximal left humeral shaft which was not seen on previous exam.  It was recommended she have an MRI for further evaluation.  MRI of left humerus without contrast was WNL.  No additional workup needed.  Reports she was recently taken off B12 shots by her PCP.  She has not had them in 2 months.  Energy levels are stable.  Today, she presents virtually discussed most recent lab results.  Denies any recent hospitalizations, surgeries or changes to her baseline health.  Appetite is 100% energy levels are 90%.  Denies any pain.    Observations/Objective: Review of Systems  Constitutional:  Negative for malaise/fatigue.  Gastrointestinal:  Positive for diarrhea and nausea.  Neurological:  Positive for dizziness and headaches.  Psychiatric/Behavioral:  The patient  is nervous/anxious.     Physical Exam Neurological:     Mental Status: She is alert and oriented to person, place, and time.     Assessment and Plan: 1.  IgM lambda monoclonal gammopathy: - Patient seen at the request of Dr. Carrolyn Clan, who noted the following lab abnormalities: Immunofixation (07/25/2021): Faint IgM lambda monoclonal protein detected Free kappa light chains 47.5, free lambda light chains 48.1, ratio 0.99 SPEP (07/25/2021) poorly defined band of restricted protein mobility in the gammaglobulin - Hematology work-up (01/11/2022): Immunofixation shows polyclonal increase in 1 or more immunoglobulins SPEP negative for M spike Elevated kappa light chains 89.3, elevated lambda 55.2.  Normal ratio 1.62, in keeping with her underlying CKD. Mildly elevated beta-2  microglobulin 4.5, normal LDH. CMP (01/11/2022): Baseline creatinine 1.22/GFR 47 in keeping with previously known CKD stage IIIa/b secondary to diabetes.  Normal calcium 9.8.  Hemoglobin normal at 12.0. - Skeletal survey (01/11/2022): No suspicious lytic or sclerotic lesions noted.   2.  Stage III CKD: - CKD since 2022, thought to be secondary to diabetes mellitus. - Nonnephrotic range proteinuria, 24-hour protein 1.3 g - Renal ultrasound (08/03/2021): Increased renal parenchymal echogenicity typical of chronic medical renal disease. - She had kidney biopsy on 07/01/2022, results pending.   3.  Social/family history: - Personal history of cervical cancer in her 45s, treated with D&C and conization - OTHER PMH: Anxiety/depression, type 2 diabetes mellitus, hypertension, hyperlipidemia, CKD, arthritis - Lives at home with son and daughter-in-law.  Mostly sits in chair in the bed.  Movement is limited by dizziness and right leg problem.  Walks with help  of walker.  Worked as a Automotive engineer prior to retirement.  Non-smoker. - No family history of multiple myeloma.  2 sisters had cervical cancer and 1 sister had lung  cancer.   PLAN: 1. MGUS (monoclonal gammopathy of unknown significance) - She denies any new bone pain or neurologic changes.  No B symptoms.   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 - Most recent lab work from 08/07/2023 shows hemoglobin 11.5/hematocrit 37.2 with essentially unremarkable differential.  Protein electrophoresis shows no evidence of an M spike (0.4).  Immunofixation shows elevated IgM levels at 362 (363) with lambda light chain specificity.  Kappa lambda light chains are elevated with normal light chain ratio.  LDH is normal.  CMP shows chronic but stable disease with creatinine of 2.01 (1.51) and GFR of 26. - Patient continues to display evidence of MGUS, without meeting criteria for multiple myeloma or necessitating bone marrow biopsy at this time. -24-hour urine UPEP from 01/31/2023 showed significant amount of protein greater than 2000 but no evidence of M spike or Bence-Jones protein.   - Patient does not meet crab criteria.  -Most recent skeletal survey (01/31/23) showed no evidence of lytic bone lesion but did show smooth periosteal reaction involving the mid and proximal humeral shaft not seen on prior exam.  This is nonspecific and MRI of the humerus was recommended. -MRI of left humerus (02/27/2023) showed normal left humerus without evidence of periosteal reaction or other abnormality. -Repeat skeletal survey prior to next visit.  Orders placed. -Recommend follow-up in 6 months with labs a few days before.  Recommend bone marrow biopsy if she develops crab criteria or significant M spike greater than 1.0 g.   2. Chronic kidney disease, unspecified CKD stage -She is followed by Dr. Carrolyn Clan. 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 -We discussed recent UPEP showed elevation in her protein greater than 2000.  -Most recent creatinine is slightly elevated from previous with a lower  GFR.  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   PLAN SUMMARY: >> Labs in 6 months (CBC/D, CMP, LDH, SPEP, immunofixation, light chains) >> Repeat skeletal survey prior to next visit. >> Office visit in 6 months, 1 week after labs    I discussed the assessment and treatment plan with the patient. The patient was provided an opportunity to ask questions and all were answered. The patient agreed with the plan and demonstrated an understanding of the instructions.   The patient was advised to call back or seek an in-person evaluation if the symptoms worsen or if the condition fails to improve as anticipated.  I provided 18 minutes of non-face-to-face time during this encounter.   Aurther Blue, NP

## 2023-09-04 DIAGNOSIS — N189 Chronic kidney disease, unspecified: Secondary | ICD-10-CM | POA: Diagnosis not present

## 2023-09-04 DIAGNOSIS — D631 Anemia in chronic kidney disease: Secondary | ICD-10-CM | POA: Diagnosis not present

## 2023-09-04 DIAGNOSIS — R809 Proteinuria, unspecified: Secondary | ICD-10-CM | POA: Diagnosis not present

## 2023-09-09 DIAGNOSIS — E785 Hyperlipidemia, unspecified: Secondary | ICD-10-CM | POA: Diagnosis not present

## 2023-09-09 DIAGNOSIS — I10 Essential (primary) hypertension: Secondary | ICD-10-CM | POA: Diagnosis not present

## 2023-09-09 DIAGNOSIS — N183 Chronic kidney disease, stage 3 unspecified: Secondary | ICD-10-CM | POA: Diagnosis not present

## 2023-09-09 DIAGNOSIS — E1165 Type 2 diabetes mellitus with hyperglycemia: Secondary | ICD-10-CM | POA: Diagnosis not present

## 2023-09-11 DIAGNOSIS — D472 Monoclonal gammopathy: Secondary | ICD-10-CM | POA: Diagnosis not present

## 2023-09-11 DIAGNOSIS — N184 Chronic kidney disease, stage 4 (severe): Secondary | ICD-10-CM | POA: Diagnosis not present

## 2023-09-11 DIAGNOSIS — E1122 Type 2 diabetes mellitus with diabetic chronic kidney disease: Secondary | ICD-10-CM | POA: Diagnosis not present

## 2023-09-11 DIAGNOSIS — R808 Other proteinuria: Secondary | ICD-10-CM | POA: Diagnosis not present

## 2023-09-23 DIAGNOSIS — R809 Proteinuria, unspecified: Secondary | ICD-10-CM | POA: Diagnosis not present

## 2023-09-23 DIAGNOSIS — E211 Secondary hyperparathyroidism, not elsewhere classified: Secondary | ICD-10-CM | POA: Diagnosis not present

## 2023-09-23 DIAGNOSIS — D631 Anemia in chronic kidney disease: Secondary | ICD-10-CM | POA: Diagnosis not present

## 2023-09-23 DIAGNOSIS — N189 Chronic kidney disease, unspecified: Secondary | ICD-10-CM | POA: Diagnosis not present

## 2023-09-26 DIAGNOSIS — N184 Chronic kidney disease, stage 4 (severe): Secondary | ICD-10-CM | POA: Diagnosis not present

## 2023-09-26 DIAGNOSIS — D472 Monoclonal gammopathy: Secondary | ICD-10-CM | POA: Diagnosis not present

## 2023-09-26 DIAGNOSIS — I129 Hypertensive chronic kidney disease with stage 1 through stage 4 chronic kidney disease, or unspecified chronic kidney disease: Secondary | ICD-10-CM | POA: Diagnosis not present

## 2023-09-26 DIAGNOSIS — D638 Anemia in other chronic diseases classified elsewhere: Secondary | ICD-10-CM | POA: Diagnosis not present

## 2023-10-20 DIAGNOSIS — H35033 Hypertensive retinopathy, bilateral: Secondary | ICD-10-CM | POA: Diagnosis not present

## 2023-10-20 DIAGNOSIS — H43823 Vitreomacular adhesion, bilateral: Secondary | ICD-10-CM | POA: Diagnosis not present

## 2023-10-20 DIAGNOSIS — E113213 Type 2 diabetes mellitus with mild nonproliferative diabetic retinopathy with macular edema, bilateral: Secondary | ICD-10-CM | POA: Diagnosis not present

## 2023-10-20 DIAGNOSIS — H35372 Puckering of macula, left eye: Secondary | ICD-10-CM | POA: Diagnosis not present

## 2023-10-20 DIAGNOSIS — H2513 Age-related nuclear cataract, bilateral: Secondary | ICD-10-CM | POA: Diagnosis not present

## 2023-12-15 ENCOUNTER — Encounter: Payer: Self-pay | Admitting: Cardiology

## 2023-12-15 ENCOUNTER — Ambulatory Visit: Attending: Cardiology | Admitting: Cardiology

## 2023-12-15 VITALS — BP 148/78 | HR 71 | Ht 62.0 in | Wt 171.6 lb

## 2023-12-15 DIAGNOSIS — R55 Syncope and collapse: Secondary | ICD-10-CM

## 2023-12-15 DIAGNOSIS — I10 Essential (primary) hypertension: Secondary | ICD-10-CM | POA: Diagnosis not present

## 2023-12-15 NOTE — Patient Instructions (Signed)
 Medication Instructions:  Continue all current medications.   Labwork: none  Testing/Procedures: none  Follow-Up: 6 months   Any Other Special Instructions Will Be Listed Below (If Applicable).   If you need a refill on your cardiac medications before your next appointment, please call your pharmacy.

## 2023-12-15 NOTE — Progress Notes (Signed)
 Clinical Summary Angelica Conway is a 73 y.o.female seen today for follow up of the following medical problems.    1. Bradycardia -ER visit [facility] 12/26/2020 with low HRs - was on cardizem and metoprolol at the time. Cardizem had recently been increased to 240mg  daily due to high bp's - from ER notes HRs in 30s, given atropine and calcium - HR imporved off meds   - 12/2021 monitor: some SVT, longest 15.6 seconds.   08/2022 monitor 30 days: symptoms correlated with sinus rhythm. Min HR 55, Max HR 98, avg HR 71   - resolved off high doses av nodal agents     2. HTN - dilt and lopressor stopped after ER visit - was to start hydralazine  25mg  tid,a s well as lisinopril /HCTZ 40/25 - listed allergy to norvasc not specified. Leg edema per her report.   -02/2021 normal renin/aldo levels   - neprhology started coreg 07/21/21  - thinks had some swelling in legs on norvasc.  - nephrology increased lisinopril  to 30mg  daily, coreg to 25mg  bid.  -medical therapy has been complicated by vasovagal presyncope/syncope episodes.  - compliant with meds     3. 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 Syncope - seen 12/2021 by NP Vannie in clinic - at that time reported 3 episodes of syncope over 1.5 months - thought to be vasovagal syncope - 12/2021 echo: LVEF 60-65%, grade I dd,  -12/2021 heart monitor: 14 day monitor, 8 runs SVT longest 15.6 sec.  - had been restricting fluid intake.    - 4-5 episodes over the last 3-4 months -. Was sitting on commode, felt like she needed to have a BM. Was not straining. Can feeling of pressure shoulders and neck, felt like she was getting ready to pass out. +LOC. Family came in and checked on her, had BM at the time. Was unresponsive for few minutes, came too and groggy   - prior episode in Sept. Occurred on commode, felt need to BM. Was not straining. Constipated, hard time passing BM. Pressure  feeling, like getting ready to go out.    -episode when came out of bathroom, got up and open door and had episode of syncope.  - limiting fluid 1200 mL -has seen GI about constipation - she reports normal orthostatics - last syncopal episode in February - she was sitting on her bed playing cards.  - felt like needed to have BM. Got up walked to the bathroom with walker - sat on commode and was using it, next thing she knew she passed out.  - since that time neprhology increased her fluid intake. Increased to 48 ounces.      - has had some dizziness, no recurrent syncope - limited fluids to 48 ounces per nephrology    Past Medical History:  Diagnosis Date   Abnormal EKG    Anxiety    Arthritis    Chronic kidney disease    stage 3   Complication of anesthesia    difficulty waking up   Diabetes mellitus without complication (HCC)    Gout    H/O degenerative disc disease    Hyperlipidemia    Hypertension      Allergies  Allergen Reactions   Amlodipine     Unknown reaction   Codeine     Unknown reaction   Influenza Vaccines Swelling    Local reaction at  site of injection  Can tolerate egg free version     Current Outpatient Medications  Medication Sig Dispense Refill   ACCU-CHEK GUIDE TEST test strip 3 (three) times daily.     Accu-Chek Softclix Lancets lancets 3 (three) times daily.     acetaminophen  (TYLENOL ) 325 MG tablet Take 650 mg by mouth every 6 (six) hours as needed for moderate pain.     allopurinol (ZYLOPRIM) 100 MG tablet Take 100 mg by mouth daily.     atorvastatin (LIPITOR) 80 MG tablet Take 80 mg by mouth daily.     carvedilol (COREG) 25 MG tablet Take 25 mg by mouth 2 (two) times daily.     cholecalciferol (VITAMIN D3) 25 MCG (1000 UNIT) tablet Take 1,000 Units by mouth daily.     escitalopram (LEXAPRO) 5 MG tablet Take 5 mg by mouth daily.     hydrALAZINE  (APRESOLINE ) 25 MG tablet TAKE 1 TABLET BY MOUTH TWICE DAILY 60 tablet 6   JARDIANCE 25 MG  TABS tablet Take 25 mg by mouth daily. (Patient taking differently: Take 12.5 mg by mouth daily.)     lisinopril  (ZESTRIL ) 30 MG tablet Take 30 mg by mouth daily.     magnesium  oxide (MAG-OX) 400 (240 Mg) MG tablet Take 400 mg by mouth 2 (two) times daily.     metFORMIN (GLUCOPHAGE) 1000 MG tablet Take 1,000 mg by mouth daily with breakfast.     omeprazole (PRILOSEC) 20 MG capsule Take 20 mg by mouth daily.      spironolactone  (ALDACTONE ) 25 MG tablet Take 12.5 mg by mouth daily.     carvedilol (COREG) 12.5 MG tablet TAKE 1 TABLET BY MOUTH TWICE DAILY WITH A MEAL (Patient not taking: Reported on 12/15/2023) 180 tablet 3   cyanocobalamin  (,VITAMIN B-12,) 1000 MCG/ML injection Inject 1,000 mcg into the muscle every Friday. (Patient not taking: Reported on 12/15/2023)     lisinopril  (ZESTRIL ) 20 MG tablet TAKE 1 TABLET BY MOUTH DAILY (Patient not taking: Reported on 12/15/2023) 30 tablet 6   No current facility-administered medications for this visit.     Past Surgical History:  Procedure Laterality Date   APPENDECTOMY  1985   cholecystectomy  1985   COLONOSCOPY     COLONOSCOPY WITH PROPOFOL  N/A 10/29/2021   Procedure: COLONOSCOPY WITH PROPOFOL ;  Surgeon: Cindie Carlin POUR, DO;  [facility];  Service: Endoscopy;  Laterality: N/A;  11:00AM, ASA 3   POLYPECTOMY  10/29/2021   Procedure: POLYPECTOMY;  Surgeon: Cindie Carlin POUR, DO;  [facility]: [facility];  Service: Endoscopy;;   TONSILLECTOMY  1957     Allergies  Allergen Reactions   Amlodipine     Unknown reaction   Codeine     Unknown reaction   Influenza Vaccines Swelling    Local reaction at site of injection  Can tolerate egg free version      Family History  Problem Relation Age of Onset   Lung cancer Mother    Heart attack Father    Heart disease Father    Diabetes Sister      Social History Angelica Conway reports that she has never smoked. She has never used smokeless tobacco. Angelica Conway reports no history of  alcohol  use.    Physical Examination Today's Vitals   12/15/23 1528 12/15/23 1557  BP: (!) 142/74 (!) 148/78  Pulse: 71   SpO2: 99%   Weight: 171 lb 9.6 oz (77.8 kg)   Height: 5' 2 (1.575 m)  Body mass index is 31.39 kg/m.  Gen: resting comfortably, no acute distress HEENT: no scleral icterus, pupils equal round and reactive, no palptable cervical adenopathy,  CV: RRR, no mrg, no jvd Resp: Clear to auscultation bilaterally GI: abdomen is soft, non-tender, non-distended, normal bowel sounds, no hepatosplenomegaly MSK: extremities are warm, no edema.  Skin: warm, no rash Neuro:  no focal deficits Psych: appropriate affect   Diagnostic Studies     Assessment and Plan  1.Syncope - vasovagal syncope, only occur with having bowel movements, no other times. History of IBS.  - has had to limit fluids due to SIADH, drinking up to 48 ounces now after prior increase which has improved symptoms - continue to monitor at this time.    2. HTN - difficult management due to vasovagal syncope - home numbers at goal, continue current meds  EKG shows NSR, RBBB  F/u 6 months      Dorn PHEBE Ross, M.D.

## 2023-12-16 DIAGNOSIS — E1165 Type 2 diabetes mellitus with hyperglycemia: Secondary | ICD-10-CM | POA: Diagnosis not present

## 2023-12-16 DIAGNOSIS — N183 Chronic kidney disease, stage 3 unspecified: Secondary | ICD-10-CM | POA: Diagnosis not present

## 2023-12-16 DIAGNOSIS — E785 Hyperlipidemia, unspecified: Secondary | ICD-10-CM | POA: Diagnosis not present

## 2023-12-16 DIAGNOSIS — I10 Essential (primary) hypertension: Secondary | ICD-10-CM | POA: Diagnosis not present

## 2023-12-18 DIAGNOSIS — Z1389 Encounter for screening for other disorder: Secondary | ICD-10-CM | POA: Diagnosis not present

## 2023-12-18 DIAGNOSIS — Z0001 Encounter for general adult medical examination with abnormal findings: Secondary | ICD-10-CM | POA: Diagnosis not present

## 2023-12-18 DIAGNOSIS — Z9189 Other specified personal risk factors, not elsewhere classified: Secondary | ICD-10-CM | POA: Diagnosis not present

## 2023-12-20 IMAGING — US US RENAL
1 series · 14 of 25 positions shown · non-contrast
Comparison: Noncontrast CT 04/18/2021

CLINICAL DATA: Hyponatremia. Hyperkalemia. Proteinuria due to type
2 diabetes. Chronic kidney disease due to type 2 diabetes.

EXAM:
RENAL / URINARY TRACT ULTRASOUND COMPLETE

[Series 1: us renal · 14 of 40 slices shown]
[im 1/40]
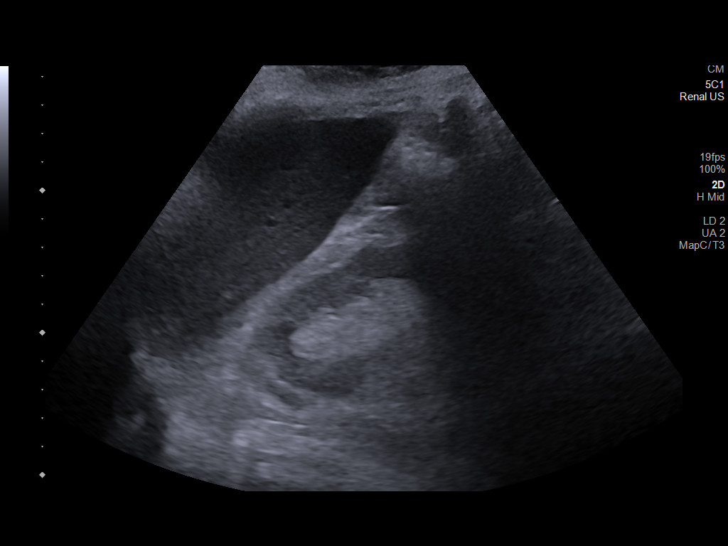
[im 4/40]
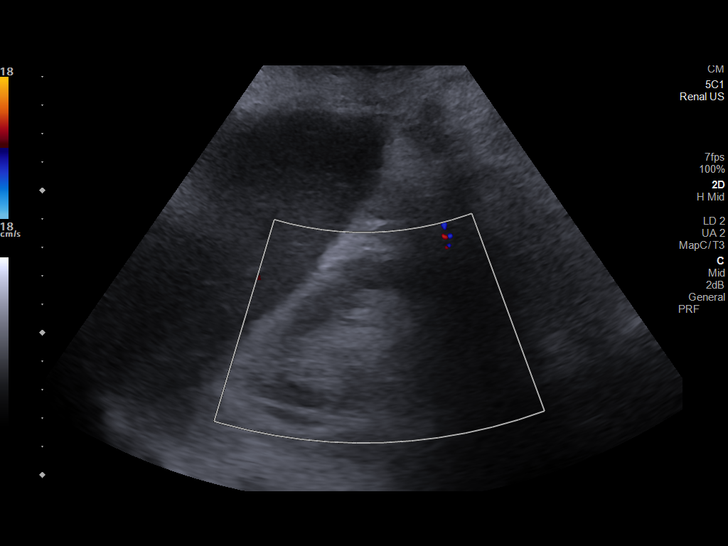
[im 7/40]
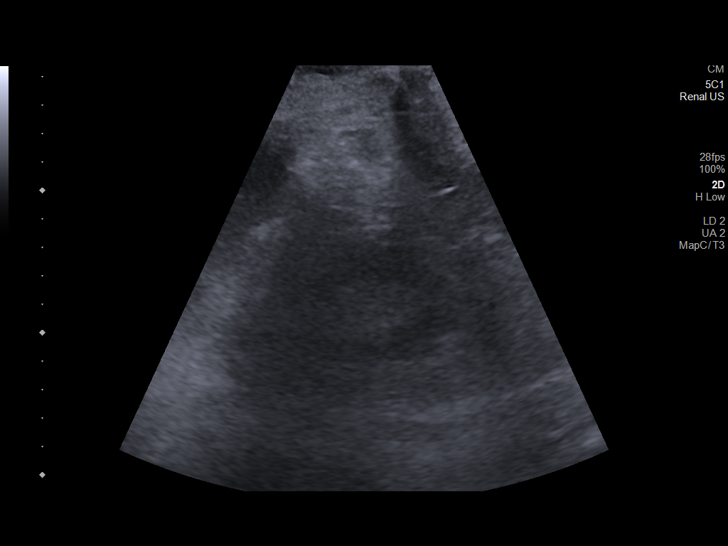
[im 10/40]
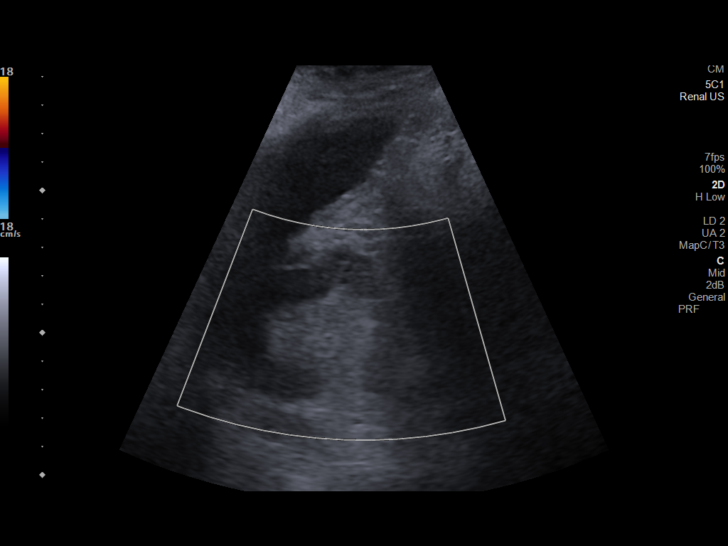
[im 14/40]
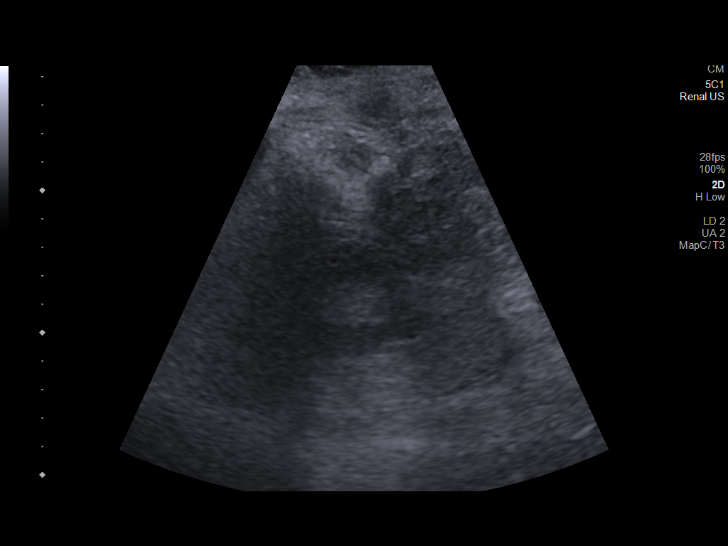
[im 15/40]
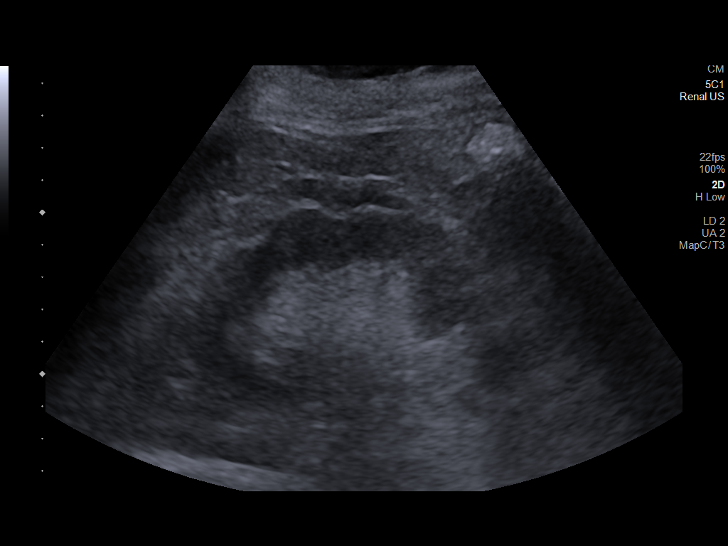
[im 18/40]
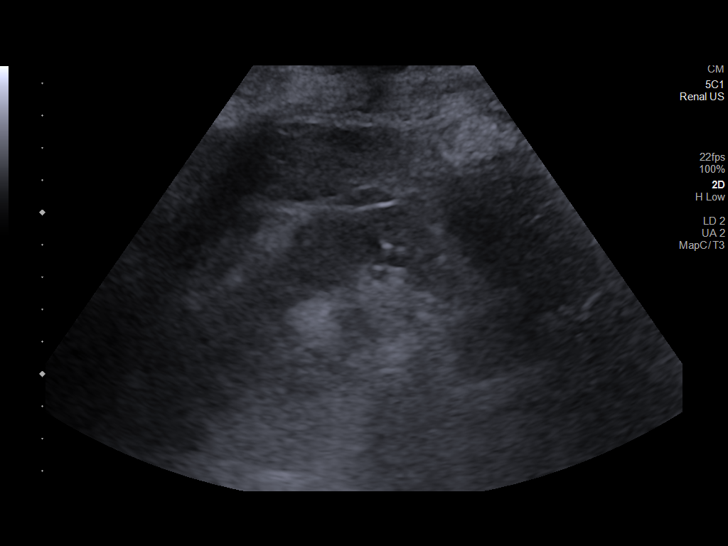
[im 22/40]
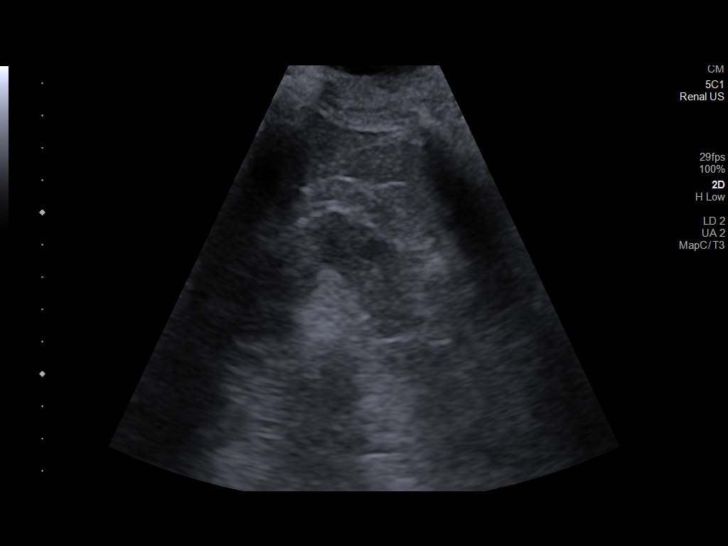
[im 25/40]
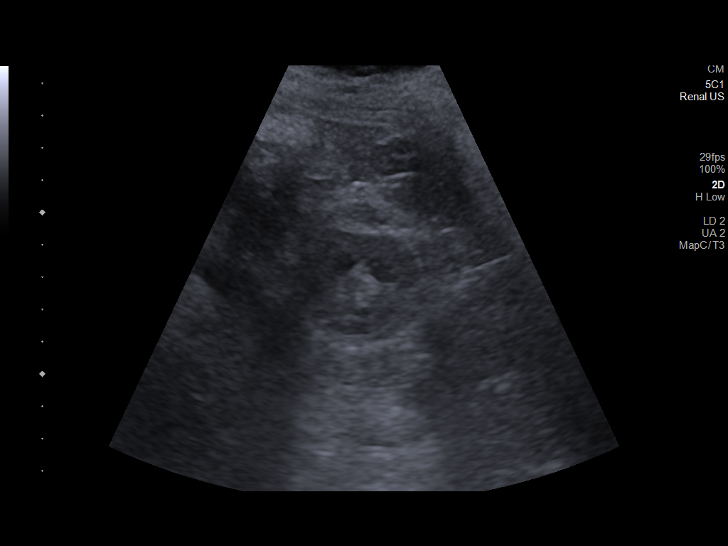
[im 27/40]
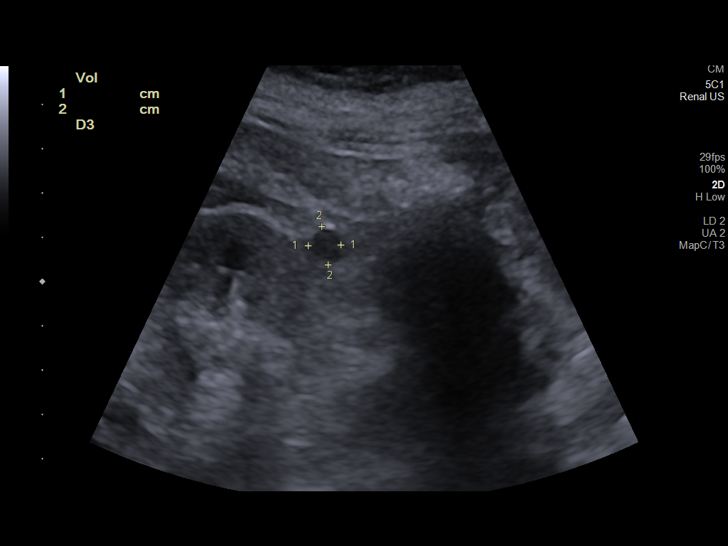
[im 30/40]
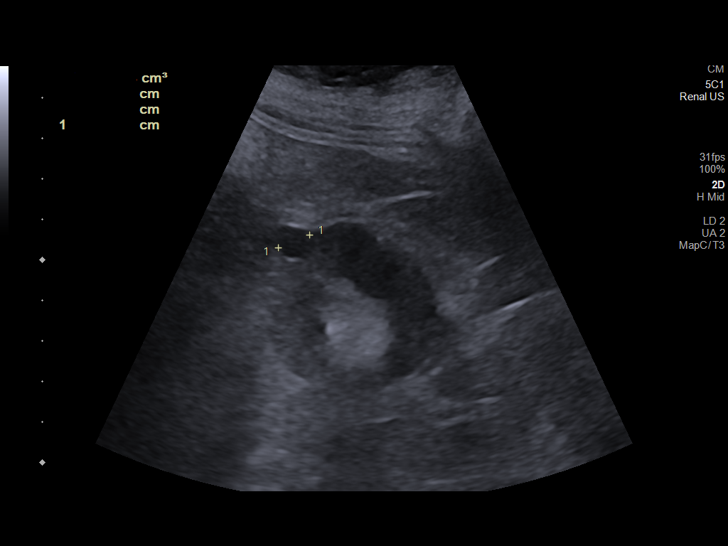
[im 33/40]
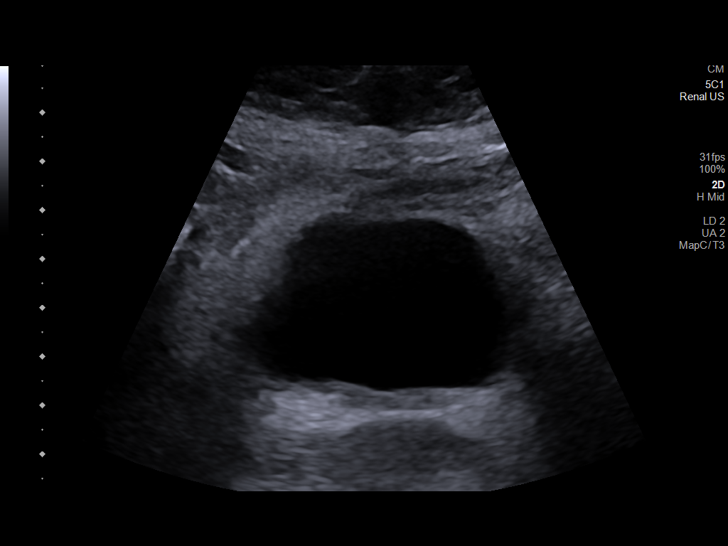
[im 36/40]
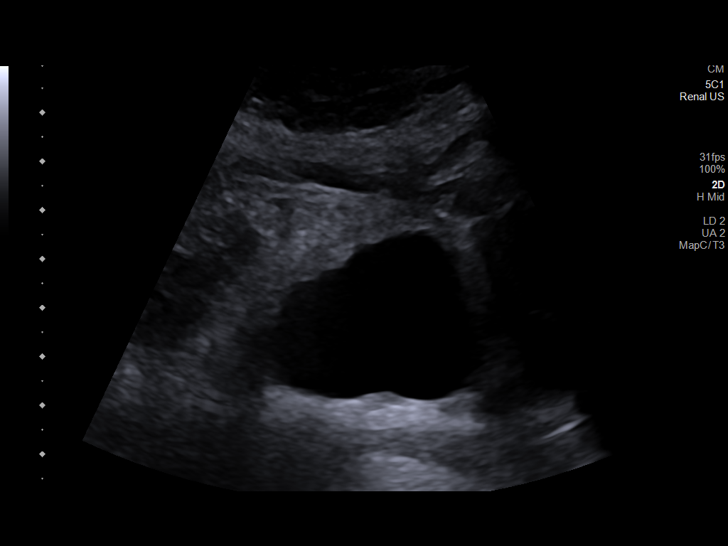
[im 40/40]
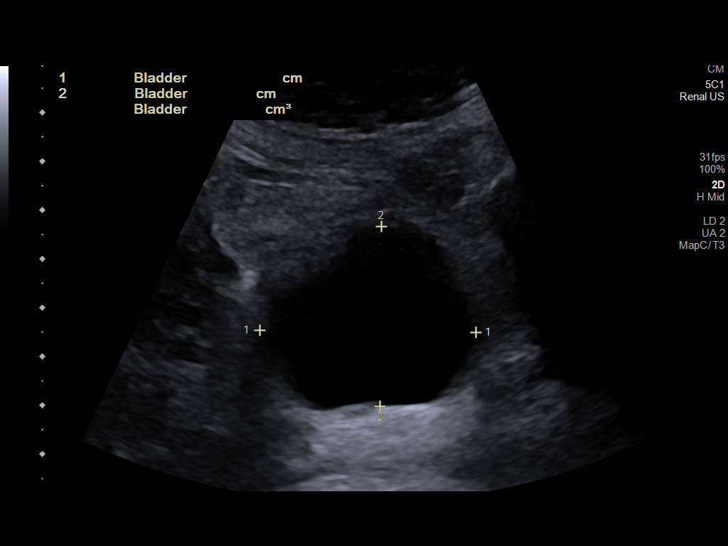

[14 of 25 positions shown; findings below may reference images not displayed]

FINDINGS: Right Kidney:

Renal measurements: 11.2 x 5.3 x 4.6 cm = volume: 144 mL. Diffusely
increased renal parenchymal echogenicity. No hydronephrosis. No
visualized renal stone. The small lower pole cyst on prior CT is not
well seen by ultrasound. Portions of the kidney are obscured by
shadowing.

Left Kidney:

Renal measurements: 9.5 x 5.9 x 4.6 cm = volume: 134 mL. Diffusely
increased renal parenchymal echogenicity. No hydronephrosis. The
upper pole cyst on prior CT is not well seen on the current exam.
There is a subcentimeter cyst arising from the lower pole. No
evidence of solid lesion. No renal calculi.

Bladder:

Appears normal for degree of bladder distention. Bladder volume is
52 cc, patient voided prior to leaving the house.

Other:

None.
IMPRESSION: 1. Increased renal parenchymal echogenicity typical of chronic
medical renal disease.
2. Small left renal cyst. Additional cysts on prior CT are not seen
on the current exam.

## 2024-01-05 DIAGNOSIS — R809 Proteinuria, unspecified: Secondary | ICD-10-CM | POA: Diagnosis not present

## 2024-01-05 DIAGNOSIS — N189 Chronic kidney disease, unspecified: Secondary | ICD-10-CM | POA: Diagnosis not present

## 2024-01-05 DIAGNOSIS — E785 Hyperlipidemia, unspecified: Secondary | ICD-10-CM | POA: Diagnosis not present

## 2024-01-05 DIAGNOSIS — E211 Secondary hyperparathyroidism, not elsewhere classified: Secondary | ICD-10-CM | POA: Diagnosis not present

## 2024-01-05 DIAGNOSIS — D631 Anemia in chronic kidney disease: Secondary | ICD-10-CM | POA: Diagnosis not present

## 2024-01-06 DIAGNOSIS — H524 Presbyopia: Secondary | ICD-10-CM | POA: Diagnosis not present

## 2024-01-12 DIAGNOSIS — N184 Chronic kidney disease, stage 4 (severe): Secondary | ICD-10-CM | POA: Diagnosis not present

## 2024-01-12 DIAGNOSIS — E1122 Type 2 diabetes mellitus with diabetic chronic kidney disease: Secondary | ICD-10-CM | POA: Diagnosis not present

## 2024-01-12 DIAGNOSIS — I129 Hypertensive chronic kidney disease with stage 1 through stage 4 chronic kidney disease, or unspecified chronic kidney disease: Secondary | ICD-10-CM | POA: Diagnosis not present

## 2024-01-12 DIAGNOSIS — R808 Other proteinuria: Secondary | ICD-10-CM | POA: Diagnosis not present

## 2024-02-18 ENCOUNTER — Inpatient Hospital Stay: Attending: Oncology

## 2024-02-18 DIAGNOSIS — N183 Chronic kidney disease, stage 3 unspecified: Secondary | ICD-10-CM | POA: Diagnosis not present

## 2024-02-18 DIAGNOSIS — D649 Anemia, unspecified: Secondary | ICD-10-CM | POA: Diagnosis not present

## 2024-02-18 DIAGNOSIS — G629 Polyneuropathy, unspecified: Secondary | ICD-10-CM | POA: Diagnosis not present

## 2024-02-18 DIAGNOSIS — I129 Hypertensive chronic kidney disease with stage 1 through stage 4 chronic kidney disease, or unspecified chronic kidney disease: Secondary | ICD-10-CM | POA: Insufficient documentation

## 2024-02-18 DIAGNOSIS — D472 Monoclonal gammopathy: Secondary | ICD-10-CM | POA: Diagnosis present

## 2024-02-18 DIAGNOSIS — R809 Proteinuria, unspecified: Secondary | ICD-10-CM | POA: Insufficient documentation

## 2024-02-18 LAB — CBC WITH DIFFERENTIAL/PLATELET
Abs Immature Granulocytes: 0.03 K/uL (ref 0.00–0.07)
Basophils Absolute: 0.1 K/uL (ref 0.0–0.1)
Basophils Relative: 1 %
Eosinophils Absolute: 0.1 K/uL (ref 0.0–0.5)
Eosinophils Relative: 2 %
HCT: 39.6 % (ref 36.0–46.0)
Hemoglobin: 11.5 g/dL — ABNORMAL LOW (ref 12.0–15.0)
Immature Granulocytes: 0 %
Lymphocytes Relative: 26 %
Lymphs Abs: 2 K/uL (ref 0.7–4.0)
MCH: 27.5 pg (ref 26.0–34.0)
MCHC: 29 g/dL — ABNORMAL LOW (ref 30.0–36.0)
MCV: 94.7 fL (ref 80.0–100.0)
Monocytes Absolute: 0.4 K/uL (ref 0.1–1.0)
Monocytes Relative: 5 %
Neutro Abs: 5.2 K/uL (ref 1.7–7.7)
Neutrophils Relative %: 66 %
Platelets: 195 K/uL (ref 150–400)
RBC: 4.18 MIL/uL (ref 3.87–5.11)
RDW: 16 % — ABNORMAL HIGH (ref 11.5–15.5)
WBC: 7.8 K/uL (ref 4.0–10.5)
nRBC: 0 % (ref 0.0–0.2)

## 2024-02-18 LAB — COMPREHENSIVE METABOLIC PANEL WITH GFR
ALT: 22 U/L (ref 0–44)
AST: 35 U/L (ref 15–41)
Albumin: 4.6 g/dL (ref 3.5–5.0)
Alkaline Phosphatase: 78 U/L (ref 38–126)
Anion gap: 15 (ref 5–15)
BUN: 25 mg/dL — ABNORMAL HIGH (ref 8–23)
CO2: 20 mmol/L — ABNORMAL LOW (ref 22–32)
Calcium: 10 mg/dL (ref 8.9–10.3)
Chloride: 107 mmol/L (ref 98–111)
Creatinine, Ser: 1.82 mg/dL — ABNORMAL HIGH (ref 0.44–1.00)
GFR, Estimated: 29 mL/min — ABNORMAL LOW (ref >60)
Glucose, Bld: 135 mg/dL — ABNORMAL HIGH (ref 70–99)
Potassium: 4.9 mmol/L (ref 3.5–5.1)
Sodium: 142 mmol/L (ref 135–145)
Total Bilirubin: 0.5 mg/dL (ref 0.0–1.2)
Total Protein: 7.7 g/dL (ref 6.5–8.1)

## 2024-02-18 LAB — LACTATE DEHYDROGENASE: LDH: 216 U/L (ref 105–235)

## 2024-02-19 LAB — KAPPA/LAMBDA LIGHT CHAINS
Kappa free light chain: 93.6 mg/L — ABNORMAL HIGH (ref 3.3–19.4)
Kappa, lambda light chain ratio: 1.23 (ref 0.26–1.65)
Lambda free light chains: 76 mg/L — ABNORMAL HIGH (ref 5.7–26.3)

## 2024-02-22 LAB — PROTEIN ELECTROPHORESIS, SERUM
A/G Ratio: 1 (ref 0.7–1.7)
Albumin ELP: 3.6 g/dL (ref 2.9–4.4)
Alpha-1-Globulin: 0.2 g/dL (ref 0.0–0.4)
Alpha-2-Globulin: 1.1 g/dL — ABNORMAL HIGH (ref 0.4–1.0)
Beta Globulin: 1.2 g/dL (ref 0.7–1.3)
Gamma Globulin: 1.1 g/dL (ref 0.4–1.8)
Globulin, Total: 3.6 g/dL (ref 2.2–3.9)
M-Spike, %: 0.5 g/dL — ABNORMAL HIGH
Total Protein ELP: 7.2 g/dL (ref 6.0–8.5)

## 2024-02-22 LAB — IMMUNOFIXATION ELECTROPHORESIS
IgA: 319 mg/dL (ref 64–422)
IgG (Immunoglobin G), Serum: 958 mg/dL (ref 586–1602)
IgM (Immunoglobulin M), Srm: 396 mg/dL — ABNORMAL HIGH (ref 26–217)
Total Protein ELP: 7.5 g/dL (ref 6.0–8.5)

## 2024-02-26 ENCOUNTER — Inpatient Hospital Stay: Admitting: Oncology

## 2024-02-26 VITALS — BP 171/78 | HR 70 | Temp 97.9°F | Resp 16 | Wt 172.8 lb

## 2024-02-26 DIAGNOSIS — D472 Monoclonal gammopathy: Secondary | ICD-10-CM | POA: Insufficient documentation

## 2024-02-26 DIAGNOSIS — N183 Chronic kidney disease, stage 3 unspecified: Secondary | ICD-10-CM

## 2024-02-26 NOTE — Patient Instructions (Signed)
 Can try colace and senna-kot for stool softners.

## 2024-02-26 NOTE — Assessment & Plan Note (Addendum)
-  She is followed by Dr. Rachele. 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 --Recent UPEP showed elevation in her protein greater than 2000.  -Most recent creatinine is stable at 1.82 and improved from previous. -Continue follow-up with nephrology.

## 2024-02-26 NOTE — Progress Notes (Signed)
 [facility] OFFICE PROGRESS NOTE  Skillman, Katherine E, PA-C  ASSESSMENT & PLAN:    Assessment & Plan MGUS (monoclonal gammopathy of unknown significance) - She denies any new bone pain or neurologic changes.  No B symptoms.   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 - Most recent lab work from 02/18/2024 showed IgM monoclonal protein with lambda specificity with elevated IgM levels.  Protein electrophoresis showed M spike 0.5 previously not observed.  Elevated kappa and lambda free light chains but normal kappa lambda ratio.  LDH 216 which is considered normal.  CBC shows mild stable anemia 11.5 with normal white count and platelets.  Differential unremarkable.  CMP shows creatinine 1.82 with GFR of 29 which is an improvement from previous. - Patient continues to display evidence of MGUS, without meeting criteria for multiple myeloma or necessitating bone marrow biopsy at this time. -24-hour urine UPEP from 01/31/2023 showed significant amount of protein greater than 2000 but no evidence of M spike or Bence-Jones protein.   - Patient does not meet crab criteria.  - Baseline skeletal survey (01/31/23) showed no evidence of lytic bone lesion but did show smooth periosteal reaction involving the mid and proximal humeral shaft not seen on prior exam.  This is nonspecific and MRI of the humerus was recommended. -MRI of left humerus (02/27/2023) showed normal left humerus without evidence of periosteal reaction or other abnormality. - No additional skeletal survey needed unless significant change in lab work. -Recommend follow-up in 6 months with labs a few days before.  Recommend bone marrow biopsy if she develops crab criteria or significant M spike greater than 1.0 g. Stage 3 chronic kidney disease, unspecified whether stage 3a or 3b CKD (HCC) -She is followed by Dr. Rachele. -Reports she had a kidney biopsy back in April (2024)  which revealed kidney fibroids. --Recent UPEP showed elevation in her protein greater than 2000.  -Most recent creatinine is stable at 1.82 and improved from previous. -Continue follow-up with nephrology.  Orders Placed This Encounter  Procedures   Immunofixation electrophoresis    Standing Status:   Future    Expected Date:   09/01/2024    Expiration Date:   02/25/2025   Protein electrophoresis, serum    Standing Status:   Future    Expected Date:   09/01/2024    Expiration Date:   02/25/2025   Kappa/lambda light chains    Standing Status:   Future    Expected Date:   09/01/2024    Expiration Date:   02/25/2025   Lactate dehydrogenase    Standing Status:   Future    Expected Date:   09/01/2024    Expiration Date:   02/25/2025   Comprehensive metabolic panel    Standing Status:   Future    Expected Date:   09/01/2024    Expiration Date:   02/25/2025   CBC with Differential    Standing Status:   Future    Expected Date:   09/01/2024    Expiration Date:   02/25/2025    INTERVAL HISTORY: Patient returns for follow-up for MGUS and stage III chronic kidney disease.  Since her last visit, she met with cardiology on 12/15/2023 for follow-up for chronic problems including bradycardia, hypertension and episodes of syncope.  Overall, patient is doing well except for recurrent syncopal episodes mainly when she is on the commode.  She feels like it is related to her vagus nerve.  She is on several blood pressure medications which she feels like may be contributing.  She has no family history of autoimmune disorders and cystic fibrosis and has concerns that this may be causing some of her symptoms.   She had a skeletal survey on 01/31/2023 which showed periarterial reaction involving the mid and proximal left humeral shaft which was not seen on previous exam.  It was recommended she have an MRI for further evaluation.  MRI of left humerus without contrast was WNL.  No additional workup  needed.  Denies any recent hospitalizations, surgeries or changes to her baseline health.  Appetite is 100% energy levels are 90%.  Denies any pain.  We reviewed CBC, CMP, myeloma labs and LDH.  SUMMARY OF HEMATOLOGIC HISTORY: Oncology History   No problem history exists.   1.  IgM lambda monoclonal gammopathy: - Patient seen at the request of Dr. Rachele, who noted the following lab abnormalities: Immunofixation (07/25/2021): Faint IgM lambda monoclonal protein detected Free kappa light chains 47.5, free lambda light chains 48.1, ratio 0.99 SPEP (07/25/2021) poorly defined band of restricted protein mobility in the gammaglobulin - Hematology work-up (01/11/2022): Immunofixation shows polyclonal increase in 1 or more immunoglobulins SPEP negative for M spike Elevated kappa light chains 89.3, elevated lambda 55.2.  Normal ratio 1.62, in keeping with her underlying CKD. Mildly elevated beta-2  microglobulin 4.5, normal LDH. CMP (01/11/2022): Baseline creatinine 1.22/GFR 47 in keeping with previously known CKD stage IIIa/b secondary to diabetes.  Normal calcium 9.8.  Hemoglobin normal at 12.0. - Skeletal survey (01/11/2022): No suspicious lytic or sclerotic lesions noted.   2.  Stage III CKD: - CKD since 2022, thought to be secondary to diabetes mellitus. - Nonnephrotic range proteinuria, 24-hour protein 1.3 g - Renal ultrasound (08/03/2021): Increased renal parenchymal echogenicity typical of chronic medical renal disease. - She had kidney biopsy on 07/01/2022, results pending.  CBC    Component Value Date/Time   WBC 7.8 02/18/2024 1030   RBC 4.18 02/18/2024 1030   HGB 11.5 (L) 02/18/2024 1030   HCT 39.6 02/18/2024 1030   PLT 195 02/18/2024 1030   MCV 94.7 02/18/2024 1030   MCH 27.5 02/18/2024 1030   MCHC 29.0 (L) 02/18/2024 1030   RDW 16.0 (H) 02/18/2024 1030   LYMPHSABS 2.0 02/18/2024 1030   MONOABS 0.4 02/18/2024 1030   EOSABS 0.1 02/18/2024 1030   BASOSABS 0.1 02/18/2024 1030        Latest Ref Rng & Units 02/18/2024   10:30 AM 08/07/2023   10:23 AM 01/31/2023   10:43 AM  CMP  Glucose 70 - 99 mg/dL 864  858  869   BUN 8 - 23 mg/dL 25  28  25    Creatinine 0.44 - 1.00 mg/dL 8.17  7.98  8.48   Sodium 135 - 145 mmol/L 142  137  139   Potassium 3.5 - 5.1 mmol/L 4.9  4.4  4.2   Chloride 98 - 111 mmol/L 107  105  104   CO2 22 - 32 mmol/L 20  21  24    Calcium 8.9 - 10.3 mg/dL 89.9  9.4  9.5   Total Protein 6.5 - 8.1 g/dL 7.7  7.4  7.3   Total Bilirubin 0.0 - 1.2 mg/dL 0.5  0.7  0.6   Alkaline Phos 38 - 126 U/L 78  90  60   AST 15 - 41 U/L 35  30  29   ALT 0 - 44 U/L 22  20  20  No results found for: LARRIE PETRI  Vitals:   02/26/24 0937 02/26/24 0941  BP: (!) 174/73 (!) 171/78  Pulse: 70   Resp: 16   Temp: 97.9 F (36.6 C)   SpO2: 98%     Review of System:  Review of Systems  Constitutional:  Positive for malaise/fatigue.  Respiratory:  Positive for cough.   Gastrointestinal:  Positive for constipation.  Neurological:  Positive for dizziness and sensory change.    Physical Exam: Physical Exam Constitutional:      Appearance: Normal appearance.  HENT:     Head: Normocephalic and atraumatic.  Eyes:     Pupils: Pupils are equal, round, and reactive to light.  Cardiovascular:     Rate and Rhythm: Normal rate and regular rhythm.     Heart sounds: Normal heart sounds. No murmur heard. Pulmonary:     Effort: Pulmonary effort is normal.     Breath sounds: Normal breath sounds. No wheezing.  Abdominal:     General: Bowel sounds are normal. There is no distension.     Palpations: Abdomen is soft.     Tenderness: There is no abdominal tenderness.  Musculoskeletal:        General: Normal range of motion.     Cervical back: Normal range of motion.  Skin:    General: Skin is warm and dry.     Findings: No rash.  Neurological:     Mental Status: She is alert and oriented to person, place, and time.     Gait: Gait is intact.   Psychiatric:        Mood and Affect: Mood and affect normal.        Cognition and Memory: Memory normal.        Judgment: Judgment normal.      I spent 25 minutes dedicated to the care of this patient (face-to-face and non-face-to-face) on the date of the encounter to include what is described in the assessment and plan.,  Delon Hope, NP 02/26/2024 5:45 PM

## 2024-02-26 NOTE — Assessment & Plan Note (Addendum)
-   She denies any new bone pain or neurologic changes.  No B symptoms.   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 - Most recent lab work from 02/18/2024 showed IgM monoclonal protein with lambda specificity with elevated IgM levels.  Protein electrophoresis showed M spike 0.5 previously not observed.  Elevated kappa and lambda free light chains but normal kappa lambda ratio.  LDH 216 which is considered normal.  CBC shows mild stable anemia 11.5 with normal white count and platelets.  Differential unremarkable.  CMP shows creatinine 1.82 with GFR of 29 which is an improvement from previous. - Patient continues to display evidence of MGUS, without meeting criteria for multiple myeloma or necessitating bone marrow biopsy at this time. -24-hour urine UPEP from 01/31/2023 showed significant amount of protein greater than 2000 but no evidence of M spike or Bence-Jones protein.   - Patient does not meet crab criteria.  - Baseline skeletal survey (01/31/23) showed no evidence of lytic bone lesion but did show smooth periosteal reaction involving the mid and proximal humeral shaft not seen on prior exam.  This is nonspecific and MRI of the humerus was recommended. -MRI of left humerus (02/27/2023) showed normal left humerus without evidence of periosteal reaction or other abnormality. - No additional skeletal survey needed unless significant change in lab work. -Recommend follow-up in 6 months with labs a few days before.  Recommend bone marrow biopsy if she develops crab criteria or significant M spike greater than 1.0 g.

## 2024-03-08 ENCOUNTER — Other Ambulatory Visit: Payer: Self-pay | Admitting: Cardiology

## 2024-04-07 ENCOUNTER — Telehealth: Payer: Self-pay | Admitting: Cardiology

## 2024-04-07 NOTE — Telephone Encounter (Signed)
 Does she strain a lot with bowel movements? If so can trigger a vagal response where bp can lower. The bp decreased but is not to a point where I would consider it significantly low, typically top number would need to be in the 90s to consider it being low. Is she having dizziness? Is she staying hydrated, at least 2 liters of water daily?   Angelica Ross mD

## 2024-04-07 NOTE — Telephone Encounter (Signed)
 Call received from PCP pharmacist requesting changes to medications.   Contacted patient to get more information.  Patient reports when she has a BM her BP has been dropping for the past 2 weeks. See readings below.  04/07/2024 @9 :30 am, BP 144/86 & HR 70; 12:00 pm after bowel movement BP 111/68 & HR 71 (no symptoms). 04/06/2024 @7 :30 am BP 155/80 & HR 66; After BM BP 100/52 & HR 73 (no symptoms)  Medications reviewed. Reports she doesn't check her blood pressure in the evenings. Encouraged to take her blood pressure in the evenings and record the readings. Advised message would be sent to provider for review and recommendations.

## 2024-04-07 NOTE — Telephone Encounter (Signed)
 Advised patient in previous call that she could be experiencing a vagal response after BM.  Reports straining with bowel movements recently and this was confirmed in the previous phone call. Reports she feels dizzy when she is having a BM; otherwise, she does not feel dizzy. Reports she is limited on her fluids and drinks 40 oz per day. Reports in the past 2 weeks, she did have blood pressures that were in the 90's after bowel movements. See dates below. 04/02/2024 BP 92/54 & BP 91/55 both after BM 04/05/2024 BP 99/56 after BM Reports some of her bowel movements are loose Reports taking her BID medications at 8 am & 8 pm Advised to continue monitoring BP and starting taking her medications at 7 am & 7 pm. Informed patient of Branch's recommendations below. Verbalized understanding.

## 2024-04-07 NOTE — Telephone Encounter (Signed)
 Pt c/o medication issue:  1. Name of Medication:   carvedilol (COREG) 25 MG tablet  hydrALAZINE  (APRESOLINE ) 25 MG tablet   2. How are you currently taking this medication (dosage and times per day)?   3. Are you having a reaction (difficulty breathing--STAT)?   4. What is your medication issue?   Caller Jenna) is requesting to change patient's carvedilol (COREG) 25 MG tablet to Carvedilol CR.  Caller also wants to change the dosage time for patient's hydrALAZINE  (APRESOLINE ) 25 MG tablet to early afternoon and late evening.  Caller stated patient has been having a drop in blood pressure (BP 100/50 average) and dizziness after taking morning meds.

## 2024-06-02 ENCOUNTER — Ambulatory Visit: Admitting: Cardiology

## 2024-08-20 ENCOUNTER — Inpatient Hospital Stay

## 2024-08-27 ENCOUNTER — Inpatient Hospital Stay

## 2024-08-27 ENCOUNTER — Inpatient Hospital Stay: Admitting: Oncology

## 2024-09-03 ENCOUNTER — Inpatient Hospital Stay: Admitting: Oncology
